# Patient Record
Sex: Female | Born: 1960 | Race: White | Hispanic: No | Marital: Single | Smoking: Former smoker
Health system: Southern US, Community
[De-identification: ages and names within clinical notes are randomized; demographics above are authoritative.]

## PROBLEM LIST (undated history)

## (undated) DIAGNOSIS — J449 Chronic obstructive pulmonary disease, unspecified: Secondary | ICD-10-CM

## (undated) DIAGNOSIS — F319 Bipolar disorder, unspecified: Secondary | ICD-10-CM

## (undated) DIAGNOSIS — M1990 Unspecified osteoarthritis, unspecified site: Secondary | ICD-10-CM

## (undated) HISTORY — PX: APPENDECTOMY: SHX54

## (undated) HISTORY — PX: ABDOMINAL HYSTERECTOMY: SHX81

## (undated) HISTORY — PX: KNEE SURGERY: SHX244

---

## 2000-09-09 ENCOUNTER — Emergency Department (HOSPITAL_COMMUNITY): Admission: EM | Admit: 2000-09-09 | Discharge: 2000-09-09 | Payer: Self-pay | Admitting: Emergency Medicine

## 2000-09-17 ENCOUNTER — Emergency Department (HOSPITAL_COMMUNITY): Admission: EM | Admit: 2000-09-17 | Discharge: 2000-09-17 | Payer: Self-pay | Admitting: Emergency Medicine

## 2000-09-17 ENCOUNTER — Encounter: Payer: Self-pay | Admitting: Emergency Medicine

## 2003-10-02 ENCOUNTER — Emergency Department (HOSPITAL_COMMUNITY): Admission: EM | Admit: 2003-10-02 | Discharge: 2003-10-02 | Payer: Self-pay | Admitting: Emergency Medicine

## 2005-03-10 ENCOUNTER — Emergency Department (HOSPITAL_COMMUNITY): Admission: EM | Admit: 2005-03-10 | Discharge: 2005-03-10 | Payer: Self-pay | Admitting: Emergency Medicine

## 2005-05-02 ENCOUNTER — Inpatient Hospital Stay (HOSPITAL_COMMUNITY): Admission: EM | Admit: 2005-05-02 | Discharge: 2005-05-04 | Payer: Self-pay | Admitting: Emergency Medicine

## 2007-10-12 ENCOUNTER — Emergency Department (HOSPITAL_COMMUNITY): Admission: EM | Admit: 2007-10-12 | Discharge: 2007-10-13 | Payer: Self-pay | Admitting: Emergency Medicine

## 2008-01-04 ENCOUNTER — Emergency Department (HOSPITAL_COMMUNITY): Admission: EM | Admit: 2008-01-04 | Discharge: 2008-01-04 | Payer: Self-pay | Admitting: Emergency Medicine

## 2008-09-27 ENCOUNTER — Emergency Department (HOSPITAL_COMMUNITY): Admission: EM | Admit: 2008-09-27 | Discharge: 2008-09-28 | Payer: Self-pay | Admitting: Emergency Medicine

## 2008-09-30 ENCOUNTER — Ambulatory Visit: Payer: Self-pay | Admitting: Psychiatry

## 2008-09-30 ENCOUNTER — Emergency Department (HOSPITAL_COMMUNITY): Admission: EM | Admit: 2008-09-30 | Discharge: 2008-09-30 | Payer: Self-pay | Admitting: Emergency Medicine

## 2008-09-30 ENCOUNTER — Inpatient Hospital Stay (HOSPITAL_COMMUNITY): Admission: EM | Admit: 2008-09-30 | Discharge: 2008-10-02 | Payer: Self-pay | Admitting: Psychiatry

## 2008-10-01 ENCOUNTER — Emergency Department (HOSPITAL_COMMUNITY): Admission: EM | Admit: 2008-10-01 | Discharge: 2008-10-01 | Payer: Self-pay | Admitting: Emergency Medicine

## 2008-12-07 ENCOUNTER — Emergency Department (HOSPITAL_COMMUNITY): Admission: EM | Admit: 2008-12-07 | Discharge: 2008-12-08 | Payer: Self-pay | Admitting: Internal Medicine

## 2008-12-08 ENCOUNTER — Inpatient Hospital Stay (HOSPITAL_COMMUNITY): Admission: EM | Admit: 2008-12-08 | Discharge: 2008-12-13 | Payer: Self-pay | Admitting: Emergency Medicine

## 2009-01-24 ENCOUNTER — Emergency Department (HOSPITAL_COMMUNITY): Admission: EM | Admit: 2009-01-24 | Discharge: 2009-01-25 | Payer: Self-pay | Admitting: Emergency Medicine

## 2010-05-21 ENCOUNTER — Emergency Department (HOSPITAL_COMMUNITY)
Admission: EM | Admit: 2010-05-21 | Discharge: 2010-05-22 | Disposition: A | Discharge status: Home / Self Care | Payer: Medicaid Other | Attending: Emergency Medicine | Admitting: Emergency Medicine

## 2010-05-21 DIAGNOSIS — R0600 Dyspnea, unspecified: Secondary | ICD-10-CM

## 2010-05-22 ENCOUNTER — Emergency Department (HOSPITAL_COMMUNITY): Payer: Medicaid Other

## 2010-05-22 DIAGNOSIS — R059 Cough, unspecified: Secondary | ICD-10-CM | POA: Insufficient documentation

## 2010-05-22 DIAGNOSIS — J811 Chronic pulmonary edema: Secondary | ICD-10-CM | POA: Insufficient documentation

## 2010-05-22 DIAGNOSIS — R0602 Shortness of breath: Secondary | ICD-10-CM | POA: Insufficient documentation

## 2010-05-22 DIAGNOSIS — F17200 Nicotine dependence, unspecified, uncomplicated: Secondary | ICD-10-CM | POA: Insufficient documentation

## 2010-05-22 DIAGNOSIS — R05 Cough: Secondary | ICD-10-CM | POA: Insufficient documentation

## 2010-05-22 DIAGNOSIS — R609 Edema, unspecified: Secondary | ICD-10-CM | POA: Insufficient documentation

## 2010-06-02 ENCOUNTER — Emergency Department (HOSPITAL_COMMUNITY)
Admission: EM | Admit: 2010-06-02 | Discharge: 2010-06-02 | Disposition: A | Discharge status: Home / Self Care | Payer: Medicaid Other | Attending: Emergency Medicine | Admitting: Emergency Medicine

## 2010-06-02 ENCOUNTER — Emergency Department (HOSPITAL_COMMUNITY): Payer: Medicaid Other

## 2010-06-02 DIAGNOSIS — J449 Chronic obstructive pulmonary disease, unspecified: Secondary | ICD-10-CM | POA: Insufficient documentation

## 2010-06-02 DIAGNOSIS — J4489 Other specified chronic obstructive pulmonary disease: Secondary | ICD-10-CM | POA: Insufficient documentation

## 2010-06-02 DIAGNOSIS — Z856 Personal history of leukemia: Secondary | ICD-10-CM | POA: Insufficient documentation

## 2010-06-02 DIAGNOSIS — F17200 Nicotine dependence, unspecified, uncomplicated: Secondary | ICD-10-CM | POA: Insufficient documentation

## 2010-06-02 DIAGNOSIS — R0602 Shortness of breath: Secondary | ICD-10-CM | POA: Insufficient documentation

## 2010-06-02 DIAGNOSIS — R079 Chest pain, unspecified: Secondary | ICD-10-CM | POA: Insufficient documentation

## 2010-06-02 DIAGNOSIS — F319 Bipolar disorder, unspecified: Secondary | ICD-10-CM | POA: Insufficient documentation

## 2010-07-27 LAB — CULTURE, BLOOD (ROUTINE X 2): Culture: NO GROWTH

## 2010-07-27 LAB — DIFFERENTIAL
Basophils Absolute: 0.1 10*3/uL (ref 0.0–0.1)
Basophils Relative: 1 % (ref 0–1)
Lymphocytes Relative: 25 % (ref 12–46)
Lymphs Abs: 2.1 10*3/uL (ref 0.7–4.0)
Monocytes Absolute: 0.6 10*3/uL (ref 0.1–1.0)
Monocytes Relative: 7 % (ref 3–12)
Neutrophils Relative %: 66 % (ref 43–77)

## 2010-07-27 LAB — CBC
HCT: 40.8 % (ref 36.0–46.0)
Hemoglobin: 13.5 g/dL (ref 12.0–15.0)
MCHC: 33.8 g/dL (ref 30.0–36.0)
Platelets: 137 10*3/uL — ABNORMAL LOW (ref 150–400)
Platelets: 158 10*3/uL (ref 150–400)
RBC: 4.07 MIL/uL (ref 3.87–5.11)
RDW: 13.8 % (ref 11.5–15.5)
WBC: 8.4 10*3/uL (ref 4.0–10.5)

## 2010-07-27 LAB — COMPREHENSIVE METABOLIC PANEL
ALT: 34 U/L (ref 0–35)
BUN: 13 mg/dL (ref 6–23)
BUN: 13 mg/dL (ref 6–23)
CO2: 24 mEq/L (ref 19–32)
Calcium: 8.1 mg/dL — ABNORMAL LOW (ref 8.4–10.5)
Creatinine, Ser: 0.87 mg/dL (ref 0.4–1.2)
Creatinine, Ser: 1.11 mg/dL (ref 0.4–1.2)
GFR calc Af Amer: >60 mL/min (ref >60)
GFR calc non Af Amer: >60 mL/min (ref >60)
Glucose, Bld: 153 mg/dL — ABNORMAL HIGH (ref 70–99)
Glucose, Bld: 95 mg/dL (ref 70–99)
Sodium: 137 mEq/L (ref 135–145)
Total Bilirubin: 0.4 mg/dL (ref 0.3–1.2)
Total Bilirubin: 0.6 mg/dL (ref 0.3–1.2)
Total Protein: 6.2 g/dL (ref 6.0–8.3)

## 2010-07-27 LAB — BASIC METABOLIC PANEL
BUN: 15 mg/dL (ref 6–23)
CO2: 28 mEq/L (ref 19–32)
Calcium: 9.4 mg/dL (ref 8.4–10.5)
GFR calc non Af Amer: >60 mL/min (ref >60)
Glucose, Bld: 101 mg/dL — ABNORMAL HIGH (ref 70–99)

## 2010-07-27 LAB — APTT: aPTT: 27 seconds (ref 24–37)

## 2010-07-27 LAB — ETHANOL: Alcohol, Ethyl (B): <5 mg/dL (ref 0–10)

## 2010-07-27 LAB — HEMOGLOBIN A1C: Mean Plasma Glucose: 111 mg/dL

## 2010-07-27 LAB — T4, FREE: Free T4: 1.24 ng/dL (ref 0.80–1.80)

## 2010-07-27 LAB — PROTIME-INR: INR: 1.2 (ref 0.00–1.49)

## 2010-07-27 LAB — LITHIUM LEVEL: Lithium Lvl: <0.25 mEq/L — ABNORMAL LOW (ref 0.80–1.40)

## 2010-07-29 LAB — POCT I-STAT, CHEM 8
BUN: 26 mg/dL — ABNORMAL HIGH (ref 6–23)
Calcium, Ion: 1.2 mmol/L (ref 1.12–1.32)
Chloride: 102 mEq/L (ref 96–112)
Creatinine, Ser: 1 mg/dL (ref 0.4–1.2)
Glucose, Bld: 90 mg/dL (ref 70–99)
TCO2: 24 mmol/L (ref 0–100)

## 2010-07-29 LAB — DIFFERENTIAL
Basophils Absolute: 0 10*3/uL (ref 0.0–0.1)
Basophils Relative: 1 % (ref 0–1)
Lymphocytes Relative: 19 % (ref 12–46)
Monocytes Absolute: 0.4 10*3/uL (ref 0.1–1.0)
Neutro Abs: 4.5 10*3/uL (ref 1.7–7.7)
Neutrophils Relative %: 73 % (ref 43–77)

## 2010-07-29 LAB — BASIC METABOLIC PANEL
CO2: 24 mEq/L (ref 19–32)
Calcium: 9 mg/dL (ref 8.4–10.5)
Creatinine, Ser: 1.07 mg/dL (ref 0.4–1.2)
GFR calc Af Amer: >60 mL/min (ref >60)
GFR calc non Af Amer: 54 mL/min — ABNORMAL LOW (ref >60)
Glucose, Bld: 113 mg/dL — ABNORMAL HIGH (ref 70–99)
Sodium: 140 mEq/L (ref 135–145)

## 2010-07-29 LAB — RAPID URINE DRUG SCREEN, HOSP PERFORMED
Barbiturates: NOT DETECTED
Cocaine: POSITIVE — AB
Opiates: POSITIVE — AB

## 2010-07-29 LAB — CBC
Hemoglobin: 13.6 g/dL (ref 12.0–15.0)
MCHC: 33.8 g/dL (ref 30.0–36.0)
RDW: 13.1 % (ref 11.5–15.5)

## 2010-07-29 LAB — POCT PREGNANCY, URINE: Preg Test, Ur: NEGATIVE
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
## 2011-05-15 ENCOUNTER — Emergency Department (HOSPITAL_COMMUNITY)
Admission: EM | Admit: 2011-05-15 | Discharge: 2011-05-15 | Disposition: A | Discharge status: Home / Self Care | Payer: Medicaid Other | Attending: Emergency Medicine | Admitting: Emergency Medicine

## 2011-05-15 ENCOUNTER — Other Ambulatory Visit (HOSPITAL_COMMUNITY): Payer: Self-pay | Admitting: Emergency Medicine

## 2011-05-15 ENCOUNTER — Emergency Department (HOSPITAL_COMMUNITY): Payer: Medicaid Other

## 2011-05-15 ENCOUNTER — Encounter (HOSPITAL_COMMUNITY): Payer: Self-pay | Admitting: *Deleted

## 2011-05-15 DIAGNOSIS — S62309A Unspecified fracture of unspecified metacarpal bone, initial encounter for closed fracture: Secondary | ICD-10-CM

## 2011-05-15 DIAGNOSIS — M79609 Pain in unspecified limb: Secondary | ICD-10-CM | POA: Insufficient documentation

## 2011-05-15 DIAGNOSIS — I498 Other specified cardiac arrhythmias: Secondary | ICD-10-CM | POA: Insufficient documentation

## 2011-05-15 DIAGNOSIS — R52 Pain, unspecified: Secondary | ICD-10-CM

## 2011-05-15 DIAGNOSIS — W101XXA Fall (on)(from) sidewalk curb, initial encounter: Secondary | ICD-10-CM | POA: Insufficient documentation

## 2011-05-15 MED ORDER — HYDROCODONE-ACETAMINOPHEN 5-325 MG PO TABS
1.0000 | ORAL_TABLET | Freq: Once | ORAL | Status: AC
  Administered 2011-05-15: 1 via ORAL
  Filled 2011-05-15: qty 1

## 2011-05-15 MED ORDER — HYDROCODONE-ACETAMINOPHEN 5-325 MG PO TABS: 1.0000 | ORAL_TABLET | ORAL | Status: AC | PRN

## 2011-05-15 MED ORDER — LIDOCAINE HCL 2 % IJ SOLN
INTRAMUSCULAR | Status: AC
  Filled 2011-05-15: qty 1

## 2011-05-15 NOTE — ED Notes (Signed)
Per ems: walking tripped over a curve. Pt denies any loc. Pt states she is having right hand pain.

## 2011-05-15 NOTE — ED Provider Notes (Signed)
History     CSN: [id number]  Arrival date & time 05/15/11  1121   First MD Initiated Contact with Patient 05/15/11 1128      Chief Complaint  Patient presents with  . Hand Pain    (Consider location/radiation/quality/duration/timing/severity/associated sxs/prior treatment) Patient is a 51 y.o. female presenting with hand pain. The history is provided by the patient.  Hand Pain This is a new problem. The current episode started today. The problem occurs constantly. The problem has been unchanged. Pertinent negatives include no chills, fever, numbness or weakness. Exacerbated by: movement. She has tried nothing for the symptoms.  Pt tripped over a curb and landed on right hand. There is associated swelling. There is no associated abrasion, laceration, numbness, tingling, or weakness.  Past Medical History  Diagnosis Date  . Bipolar 1 disorder   . Emphysema     Past Surgical History  Procedure Date  . Abdominal hysterectomy     No family history on file.  History  Substance Use Topics  . Smoking status: Current Everyday Smoker  . Smokeless tobacco: Never Used  . Alcohol Use: Yes     Review of Systems  Constitutional: Negative for fever and chills.  Musculoskeletal: Negative for gait problem.       See HPI  Skin: Negative for color change and wound.  Neurological: Negative for weakness and numbness.    Allergies  Review of patient's allergies indicates no known allergies.  Home Medications   Current Outpatient Rx  Name Route Sig Dispense Refill  . DEPAKOTE PO Oral Take by mouth. Dosage unknown    . LITHIUM CARBONATE PO Oral Take by mouth. Dosage unknown    . SEROQUEL PO Oral Take 1 tablet by mouth daily. Pt doesn't know dosage or what pharmacy she fills this at    . TRAZODONE HCL PO Oral Take by mouth. Dosage unknown      BP 134/86  Pulse 106  Temp(Src) 97.7 F (36.5 C) (Oral)  Resp 18  SpO2 96%  Physical Exam  Vitals reviewed. Constitutional: She is  oriented to person, place, and time. She appears well-developed and well-nourished.       Uncomfortable appearing  HENT:  Head: Normocephalic and atraumatic.  Right Ear: External ear normal.  Left Ear: External ear normal.  Eyes: Pupils are equal, round, and reactive to light.  Neck: Neck supple.  Cardiovascular: Regular rhythm and intact distal pulses.        Slightly tachycardic  Pulmonary/Chest: Effort normal. No respiratory distress.  Musculoskeletal:       Right hand: She exhibits decreased range of motion, tenderness and swelling. She exhibits normal capillary refill and no deformity. normal sensation noted. She exhibits no finger abduction.       Hands: Neurological: She is alert and oriented to person, place, and time.       Sensation intact to light touch. Good 2 point discrimination.  Skin: Skin is warm and dry.       Healing abrasion over 5th MCP joint- from prior injury per pt    ED Course  Procedures (including critical care time)  Labs Reviewed - No data to display Dg Hand Complete Left  05/15/2011  *RADIOLOGY REPORT*  Clinical Data: Fall.  Hand injury and pain.  LEFT HAND - COMPLETE 3+ VIEW  Comparison: None.  Findings: A minimally-displaced fracture of the distal fourth metacarpal is seen, which remains in the near anatomic alignment. No other acute fractures are identified.  No evidence of  dislocation.  IMPRESSION: Distal fourth metacarpal fracture, without significant displacement or angulation.  Original Report Authenticated By: Danae Orleans, M.D.     Dx 1: Fourth metacarpal Fx   MDM  Mechanical fall. Although x-ray indicates left hand, I have confirmed with the patient and technologist that the right hand was imaged. 4th Metacarpal fx as noted above. Ulna gutter splint placed. Ortho f/u. Pt advised of plan.        Elwyn Reach [employer], [place] 05/15/11 1356

## 2011-05-16 NOTE — ED Provider Notes (Signed)
Medical screening examination/treatment/procedure(s) were performed by non-physician practitioner and as supervising physician I was immediately available for consultation/collaboration.  Jayvien Rowlette, MD 05/16/11 0705 

## 2011-05-24 ENCOUNTER — Encounter (HOSPITAL_COMMUNITY): Payer: Self-pay

## 2011-05-24 ENCOUNTER — Emergency Department (HOSPITAL_COMMUNITY): Payer: Medicaid Other

## 2011-05-24 ENCOUNTER — Encounter (HOSPITAL_COMMUNITY): Payer: Self-pay | Admitting: *Deleted

## 2011-05-24 ENCOUNTER — Emergency Department (HOSPITAL_COMMUNITY)
Admission: EM | Admit: 2011-05-24 | Discharge: 2011-05-24 | Disposition: A | Discharge status: Home / Self Care | Payer: Medicaid Other | Attending: Emergency Medicine | Admitting: Emergency Medicine

## 2011-05-24 ENCOUNTER — Emergency Department (HOSPITAL_COMMUNITY)
Admission: EM | Admit: 2011-05-24 | Discharge: 2011-05-25 | Discharge status: Against Medical Advice | Payer: Medicaid Other | Attending: Emergency Medicine | Admitting: Emergency Medicine

## 2011-05-24 DIAGNOSIS — R079 Chest pain, unspecified: Secondary | ICD-10-CM | POA: Insufficient documentation

## 2011-05-24 DIAGNOSIS — R52 Pain, unspecified: Secondary | ICD-10-CM | POA: Insufficient documentation

## 2011-05-24 DIAGNOSIS — R059 Cough, unspecified: Secondary | ICD-10-CM | POA: Insufficient documentation

## 2011-05-24 DIAGNOSIS — R05 Cough: Secondary | ICD-10-CM | POA: Insufficient documentation

## 2011-05-24 DIAGNOSIS — R000 Tachycardia, unspecified: Secondary | ICD-10-CM | POA: Insufficient documentation

## 2011-05-24 DIAGNOSIS — R6883 Chills (without fever): Secondary | ICD-10-CM | POA: Insufficient documentation

## 2011-05-24 DIAGNOSIS — M25539 Pain in unspecified wrist: Secondary | ICD-10-CM | POA: Insufficient documentation

## 2011-05-24 DIAGNOSIS — J069 Acute upper respiratory infection, unspecified: Secondary | ICD-10-CM | POA: Insufficient documentation

## 2011-05-24 DIAGNOSIS — R609 Edema, unspecified: Secondary | ICD-10-CM | POA: Insufficient documentation

## 2011-05-24 DIAGNOSIS — R112 Nausea with vomiting, unspecified: Secondary | ICD-10-CM | POA: Insufficient documentation

## 2011-05-24 MED ORDER — OXYCODONE-ACETAMINOPHEN 5-325 MG PO TABS
1.0000 | ORAL_TABLET | Freq: Once | ORAL | Status: AC
  Administered 2011-05-24: 1 via ORAL
  Filled 2011-05-24: qty 1

## 2011-05-24 MED ORDER — IBUPROFEN 200 MG PO TABS
400.0000 mg | ORAL_TABLET | Freq: Once | ORAL | Status: AC
  Administered 2011-05-24: 400 mg via ORAL
  Filled 2011-05-24: qty 2

## 2011-05-24 NOTE — ED Provider Notes (Signed)
History    51yf with cough and subjective fever and chills. Gradual onset about 3d ago. Persistent since. Cough nonproductive. No sob. Nausea and vomiting x1 yesterday. Feels achy.  Specifically denied diarrhea to me although mentioned in triage note. No sore throat or ear pain. No sick contacts. Denies hx of blood clot. Is a smoker.   CSN: [id number]  Arrival date & time 05/24/11  1512   First MD Initiated Contact with Patient 05/24/11 1523      Chief Complaint  Patient presents with  . Influenza  . Nausea  . Emesis  . Diarrhea    (Consider location/radiation/quality/duration/timing/severity/associated sxs/prior treatment) HPI  Past Medical History  Diagnosis Date  . Bipolar 1 disorder   . Emphysema   . Polysubstance abuse   . Bronchitis   . Emphysema     Past Surgical History  Procedure Date  . Abdominal hysterectomy     No family history on file.  History  Substance Use Topics  . Smoking status: Current Everyday Smoker  . Smokeless tobacco: Never Used  . Alcohol Use: Yes    OB History    Grav Para Term Preterm Abortions TAB SAB Ect Mult Living                  Review of Systems   Review of symptoms negative unless otherwise noted in HPI.   Allergies  Review of patient's allergies indicates no known allergies.  Home Medications   Current Outpatient Rx  Name Route Sig Dispense Refill  . DIVALPROEX SODIUM 500 MG PO TBEC Oral Take 500 mg by mouth 3 (three) times daily.    Marland Kitchen HYDROCHLOROTHIAZIDE 25 MG PO TABS Oral Take 25 mg by mouth daily.    Marland Kitchen HYDROCODONE-ACETAMINOPHEN 5-325 MG PO TABS Oral Take 1-2 tablets by mouth every 4 (four) hours as needed for pain. 20 tablet 0  . LITHIUM CARBONATE 150 MG PO CAPS Oral Take 150 mg by mouth 2 (two) times daily with a meal.      BP 111/68  Pulse 91  Temp(Src) 99.2 F (37.3 C) (Oral)  Resp 20  SpO2 98%  Physical Exam  Nursing note and vitals reviewed. Constitutional: She appears well-developed and  well-nourished. No distress.  HENT:  Head: Normocephalic and atraumatic.  Right Ear: External ear normal.  Left Ear: External ear normal.  Mouth/Throat: Oropharynx is clear and moist.       TMs clear b/l. Mucus membranes moist.   Eyes: Conjunctivae are normal. Pupils are equal, round, and reactive to light. Right eye exhibits no discharge. Left eye exhibits no discharge.  Neck: Normal range of motion. Neck supple.  Cardiovascular: Regular rhythm and normal heart sounds.  Exam reveals no gallop and no friction rub.   No murmur heard.      Mild tachycardia. No murmur or rub.  Pulmonary/Chest: Effort normal and breath sounds normal. No respiratory distress. She has no wheezes. She exhibits no tenderness.  Abdominal: Soft. She exhibits no distension. There is no tenderness.  Musculoskeletal: She exhibits edema. She exhibits no tenderness.       Mild symmetric LE edema. No calf tenderness. Negative Homan's.  Lymphadenopathy:    She has no cervical adenopathy.  Neurological: She is alert.  Skin: Skin is warm and dry. She is not diaphoretic.  Psychiatric: She has a normal mood and affect. Her behavior is normal. Thought content normal.    ED Course  Procedures (including critical care time)  Labs Reviewed - No data  to display No results found.   1. URI (upper respiratory infection)       MDM  51 year old female multiple complaints. Suspect viral URI. Doubt bacterial etiology. Patient is no respiratory distress on examination. Patient is requesting Percocet for pain from her recent fall and hand fracture. Patient has not followed up with orthopedics. Given the duration of time since his injury patient was instructed to take NSAIDs because of the need to follow up with orthopedics as soon as she can.        Raeford Razor, MD 06/04/11 [phone]

## 2011-05-24 NOTE — ED Notes (Signed)
Per [facility]- pt presents with no acute distress c/o of N/V/D x 3 days and body aches- chills

## 2011-05-24 NOTE — ED Notes (Signed)
Pt states she has flu-like symptoms x 3 days with nausea, vomiting, diarrhea, and chills pt states also positive fever. Pt is alert and oriented at this time.

## 2011-05-24 NOTE — ED Notes (Signed)
Pt states that she "has been here since 1400 today, arriving by ambulance" she "had to reenter to be seen".  Pt now stating that she is having flu like symptoms including body aches and chills.

## 2011-05-24 NOTE — ED Notes (Signed)
Discharge instructions given, verbalized understanding

## 2011-05-24 NOTE — ED Notes (Signed)
Pt requesting one percocet until she can follow up with an MD on Monday for broken arm.

## 2011-05-24 NOTE — ED Notes (Signed)
Patient transported to X-ray 

## 2011-05-25 NOTE — ED Notes (Signed)
Pt left very disgruntled and yelling.  Refused to sign AMA.

## 2011-06-25 ENCOUNTER — Encounter (HOSPITAL_COMMUNITY): Payer: Self-pay | Admitting: *Deleted

## 2011-06-25 ENCOUNTER — Emergency Department (HOSPITAL_COMMUNITY): Payer: Medicaid Other

## 2011-06-25 ENCOUNTER — Emergency Department (HOSPITAL_COMMUNITY)
Admission: EM | Admit: 2011-06-25 | Discharge: 2011-06-25 | Disposition: A | Discharge status: Home / Self Care | Payer: Medicaid Other | Attending: Emergency Medicine | Admitting: Emergency Medicine

## 2011-06-25 DIAGNOSIS — R509 Fever, unspecified: Secondary | ICD-10-CM | POA: Insufficient documentation

## 2011-06-25 DIAGNOSIS — J3489 Other specified disorders of nose and nasal sinuses: Secondary | ICD-10-CM | POA: Insufficient documentation

## 2011-06-25 DIAGNOSIS — J189 Pneumonia, unspecified organism: Secondary | ICD-10-CM | POA: Insufficient documentation

## 2011-06-25 DIAGNOSIS — R079 Chest pain, unspecified: Secondary | ICD-10-CM | POA: Insufficient documentation

## 2011-06-25 DIAGNOSIS — R05 Cough: Secondary | ICD-10-CM | POA: Insufficient documentation

## 2011-06-25 DIAGNOSIS — J438 Other emphysema: Secondary | ICD-10-CM | POA: Insufficient documentation

## 2011-06-25 DIAGNOSIS — R059 Cough, unspecified: Secondary | ICD-10-CM | POA: Insufficient documentation

## 2011-06-25 DIAGNOSIS — F319 Bipolar disorder, unspecified: Secondary | ICD-10-CM | POA: Insufficient documentation

## 2011-06-25 DIAGNOSIS — M549 Dorsalgia, unspecified: Secondary | ICD-10-CM | POA: Insufficient documentation

## 2011-06-25 MED ORDER — OXYCODONE-ACETAMINOPHEN 5-325 MG PO TABS: 1.0000 | ORAL_TABLET | ORAL | Status: AC | PRN

## 2011-06-25 MED ORDER — KETOROLAC TROMETHAMINE 60 MG/2ML IM SOLN
60.0000 mg | Freq: Once | INTRAMUSCULAR | Status: AC
  Administered 2011-06-25: 60 mg via INTRAMUSCULAR
  Filled 2011-06-25: qty 2

## 2011-06-25 MED ORDER — HYDROCODONE-HOMATROPINE 5-1.5 MG/5ML PO SYRP: 5.0000 mL | ORAL_SOLUTION | Freq: Four times a day (QID) | ORAL | Status: AC | PRN

## 2011-06-25 MED ORDER — ALBUTEROL SULFATE HFA 108 (90 BASE) MCG/ACT IN AERS: 1.0000 | INHALATION_SPRAY | Freq: Four times a day (QID) | RESPIRATORY_TRACT | Status: DC | PRN

## 2011-06-25 MED ORDER — AZITHROMYCIN 250 MG PO TABS: 250.0000 mg | ORAL_TABLET | Freq: Every day | ORAL | Status: AC

## 2011-06-25 NOTE — ED Notes (Signed)
Pt in by EMS, c/o lower R back pain x5 days. Also reports cough/congestion. Denies n/v/d, body aches.

## 2011-06-25 NOTE — Progress Notes (Signed)
ED Cm contacted by ED RN, Consuella Lose to assist pt with how to find pcp.  Spoke with pt who has lived in [place] but recently moved by from [place], [place]. Pt allow time to ventilate her feelings.   Pt provided list of [place] accepting providers and other [place] resources. CM reviewed with pt the process for changing MD and county information with DSS office so that new pcp will be able to see her [facility] provider).  Voiced understanding and appreciation of services.  Needing various amounts of redirecting during visit with pt.  Encouraged pt to complete process to obtain a pcp vs frequent ED visits

## 2011-06-25 NOTE — ED Notes (Signed)
Pt states "about 3 weeks ago I came over here, I told the doctors then I had pneumonia, they x-rayed me but I don't want to talk about it, my right side hurts"; pt indicates right rib area

## 2011-06-25 NOTE — Discharge Instructions (Signed)
Your x-ray showed a likely pneumonia in your right lung. This is most likely causing your pain. Please take all the antibiotics as prescribed. Use the pain medicine and cough medicine as needed. If you're having worsening chest pain or shortness of breath, worsening swelling in your legs, or any other worrisome symptoms, please return to the ER.  Pneumonia, Adult Pneumonia is an infection of the lungs.  CAUSES Pneumonia may be caused by bacteria or a virus. Usually, these infections are caused by breathing infectious particles into the lungs (respiratory tract). SYMPTOMS   Cough.   Fever.   Chest pain.   Increased rate of breathing.   Wheezing.   Mucus production.  DIAGNOSIS  If you have the common symptoms of pneumonia, your caregiver will typically confirm the diagnosis with a chest X-ray. The X-ray will show an abnormality in the lung (pulmonary infiltrate) if you have pneumonia. Other tests of your blood, urine, or sputum may be done to find the specific cause of your pneumonia. Your caregiver may also do tests (blood gases or pulse oximetry) to see how well your lungs are working. TREATMENT  Some forms of pneumonia may be spread to other people when you cough or sneeze. You may be asked to wear a mask before and during your exam. Pneumonia that is caused by bacteria is treated with antibiotic medicine. Pneumonia that is caused by the influenza virus may be treated with an antiviral medicine. Most other viral infections must run their course. These infections will not respond to antibiotics.  PREVENTION A pneumococcal shot (vaccine) is available to prevent a common bacterial cause of pneumonia. This is usually suggested for:  People over 43 years old.   Patients on chemotherapy.   People with chronic lung problems, such as bronchitis or emphysema.   People with immune system problems.  If you are over 65 or have a high risk condition, you may receive the pneumococcal vaccine if  you have not received it before. In some countries, a routine influenza vaccine is also recommended. This vaccine can help prevent some cases of pneumonia.You may be offered the influenza vaccine as part of your care. If you smoke, it is time to quit. You may receive instructions on how to stop smoking. Your caregiver can provide medicines and counseling to help you quit. HOME CARE INSTRUCTIONS   Cough suppressants may be used if you are losing too much rest. However, coughing protects you by clearing your lungs. You should avoid using cough suppressants if you can.   Your caregiver may have prescribed medicine if he or she thinks your pneumonia is caused by a bacteria or influenza. Finish your medicine even if you start to feel better.   Your caregiver may also prescribe an expectorant. This loosens the mucus to be coughed up.   Only take over-the-counter or prescription medicines for pain, discomfort, or fever as directed by your caregiver.   Do not smoke. Smoking is a common cause of bronchitis and can contribute to pneumonia. If you are a smoker and continue to smoke, your cough may last several weeks after your pneumonia has cleared.   A cold steam vaporizer or humidifier in your room or home may help loosen mucus.   Coughing is often worse at night. Sleeping in a semi-upright position in a recliner or using a couple pillows under your head will help with this.   Get rest as you feel it is needed. Your body will usually let you know when you  need to rest.  SEEK IMMEDIATE MEDICAL CARE IF:   Your illness becomes worse. This is especially true if you are elderly or weakened from any other disease.   You cannot control your cough with suppressants and are losing sleep.   You begin coughing up blood.   You develop pain which is getting worse or is uncontrolled with medicines.   You have a fever.   Any of the symptoms which initially brought you in for treatment are getting worse  rather than better.   You develop shortness of breath or chest pain.  MAKE SURE YOU:   Understand these instructions.   Will watch your condition.   Will get help right away if you are not doing well or get worse.  Document Released: 04/07/2005 Document Revised: 03/27/2011 Document Reviewed: 06/27/2010 [facility] Patient Information 2012 [place], [place].

## 2011-06-25 NOTE — ED Provider Notes (Signed)
History     CSN: [id number]  Arrival date & time 06/25/11  1755   First MD Initiated Contact with Patient 06/25/11 1900      Chief Complaint  Patient presents with  . Back Pain  . Cough  . Nasal Congestion    (Consider location/radiation/quality/duration/timing/severity/associated sxs/prior treatment) The history is provided by the patient.   51 year old female with past medical history of tobacco abuse, emphysema, bipolar presents with right rib pain, cough, achiness. She states that she was seen here in early February for cough, subjective fever and chills. A chest x-ray was performed at that time which was negative for pneumonia or other acute findings. She states that she has had increased pain to the area since about one week ago. The pain is described as sharp and stabbing, and is located to the right lower posterior ribs. There no known aggravating or alleviating factors. It is not positional or pleuritic in nature. It is not worsened with movement. She does have an associated cough, which she states is productive of scant sputum. She has had no associated leg swelling or shortness of breath. Denies abdominal pain, nausea, vomiting, diarrhea. Denies fever or chills.  Patient has no prior history of DVT/PE. Denies recent trauma, surgery, or prolonged immobilization. Denies hemoptysis or use of exogenous estrogen. No personal hx cancer.  Past Medical History  Diagnosis Date  . Bipolar 1 disorder   . Emphysema   . Polysubstance abuse   . Bronchitis   . Emphysema     Past Surgical History  Procedure Date  . Abdominal hysterectomy   . Knee surgery     laser to left    No family history on file.  History  Substance Use Topics  . Smoking status: Current Everyday Smoker -- 0.5 packs/day  . Smokeless tobacco: Never Used  . Alcohol Use: Yes    OB History    Grav Para Term Preterm Abortions TAB SAB Ect Mult Living                  Review of Systems  Constitutional:  Positive for fatigue. Negative for fever, chills, activity change, appetite change and unexpected weight change.  HENT: Negative for sore throat, facial swelling, rhinorrhea and neck pain.   Eyes: Negative.   Respiratory: Negative for chest tightness and shortness of breath.   Cardiovascular: Negative for palpitations and leg swelling.  Gastrointestinal: Negative for nausea, vomiting, abdominal pain and diarrhea.  Genitourinary: Negative for flank pain.  Musculoskeletal: Positive for myalgias.  Skin: Negative for color change and rash.  Neurological: Negative for dizziness, weakness and headaches.    Allergies  Review of patient's allergies indicates no known allergies.  Home Medications   Current Outpatient Rx  Name Route Sig Dispense Refill  . ALBUTEROL SULFATE HFA 108 (90 BASE) MCG/ACT IN AERS Inhalation Inhale 2 puffs into the lungs every 6 (six) hours as needed.    Marland Kitchen DIVALPROEX SODIUM 500 MG PO TBEC Oral Take 500 mg by mouth 2 (two) times daily.     Marland Kitchen LITHIUM CARBONATE 150 MG PO CAPS Oral Take 150 mg by mouth 2 (two) times daily with a meal.    . VITAMIN B-12 1000 MCG PO TABS Oral Take 1,000 mcg by mouth daily.      BP 128/81  Pulse 88  Temp(Src) 97.8 F (36.6 C) (Oral)  Resp 24  SpO2 96%  Physical Exam  Nursing note and vitals reviewed. Constitutional: She appears well-developed and well-nourished. No distress.  HENT:  Head: Normocephalic and atraumatic.  Right Ear: External ear normal.  Left Ear: External ear normal.  Nose: Nose normal.  Mouth/Throat: Oropharynx is clear and moist. No oropharyngeal exudate.       TMs clear bilaterally. There is no sinus tenderness to palpation.  Eyes: Conjunctivae and EOM are normal. Pupils are equal, round, and reactive to light.  Neck: Normal range of motion.  Cardiovascular: Normal rate, regular rhythm and normal heart sounds.   Pulmonary/Chest: Effort normal and breath sounds normal. She has no wheezes.       Mild tenderness  to palpation over right posterior lower ribs without obvious deformity, crepitus, swelling. Expansion is equal bilaterally on respiration.  Abdominal: Soft. Bowel sounds are normal. There is no tenderness. There is no rebound and no guarding.  Musculoskeletal: Normal range of motion.       Mild symmetric lower extremity edema without calf tenderness. No erythema noted.  Neurological: She is alert.  Skin: Skin is warm and dry. She is not diaphoretic.  Psychiatric: She has a normal mood and affect.    ED Course  Procedures (including critical care time)  Labs Reviewed - No data to display Dg Ribs Unilateral W/chest Right  06/25/2011  *RADIOLOGY REPORT*  Clinical Data: No injury.  Right posterior rib pain  RIGHT RIBS AND CHEST - 3+ VIEW  Comparison: 05/24/2011  Findings: COPD with hyperinflation.  Patchy right lower lobe airspace disease was not present previously and may represent pneumonia.  No significant pleural effusion.  Right rib detail reveals no rib fracture or rib lesion.  IMPRESSION: Mild right lower lobe infiltrate, possible pneumonia.  Negative for right rib fracture.  Original Report Authenticated By: Camelia Phenes, M.D.     1. Community acquired pneumonia       MDM  Patient with one week of pain to the right lateral ribs. This pain is not pleuritic in nature, and it is not worsened with movement. Her vital signs today are normal, with normal oxygen saturation, respirations, pulse, and temperature. Chest x-ray, which I personally reviewed and reviewed with the patient, shows mild appearing patchy right lower lobe infiltrate. Will treat as CAP. Pt given rx for abx and analgesics for pain. Strict return precautions discussed.       Grant Fontana, [place] 06/26/11 [phone]

## 2011-06-28 NOTE — ED Provider Notes (Signed)
Medical screening examination/treatment/procedure(s) were performed by non-physician practitioner and as supervising physician I was immediately available for consultation/collaboration.  Ernesta Trabert, MD 06/28/11 1719 

## 2011-07-18 ENCOUNTER — Encounter (HOSPITAL_COMMUNITY): Payer: Self-pay | Admitting: Adult Health

## 2011-07-18 ENCOUNTER — Emergency Department (HOSPITAL_COMMUNITY): Payer: Medicaid Other

## 2011-07-18 ENCOUNTER — Emergency Department (HOSPITAL_COMMUNITY)
Admission: EM | Admit: 2011-07-18 | Discharge: 2011-07-18 | Disposition: A | Discharge status: Home / Self Care | Payer: Medicaid Other | Attending: Emergency Medicine | Admitting: Emergency Medicine

## 2011-07-18 DIAGNOSIS — J40 Bronchitis, not specified as acute or chronic: Secondary | ICD-10-CM

## 2011-07-18 DIAGNOSIS — F319 Bipolar disorder, unspecified: Secondary | ICD-10-CM | POA: Insufficient documentation

## 2011-07-18 DIAGNOSIS — J438 Other emphysema: Secondary | ICD-10-CM | POA: Insufficient documentation

## 2011-07-18 DIAGNOSIS — F17200 Nicotine dependence, unspecified, uncomplicated: Secondary | ICD-10-CM | POA: Insufficient documentation

## 2011-07-18 LAB — POCT I-STAT, CHEM 8
BUN: 29 mg/dL — ABNORMAL HIGH (ref 6–23)
Creatinine, Ser: 0.9 mg/dL (ref 0.50–1.10)
Potassium: 4.1 mEq/L (ref 3.5–5.1)
Sodium: 144 mEq/L (ref 135–145)
TCO2: 20 mmol/L (ref 0–100)

## 2011-07-18 LAB — D-DIMER, QUANTITATIVE: D-Dimer, Quant: 0.25 ug/mL-FEU (ref 0.00–0.48)

## 2011-07-18 MED ORDER — HYDROCODONE-HOMATROPINE 5-1.5 MG/5ML PO SYRP: 5.0000 mL | ORAL_SOLUTION | Freq: Four times a day (QID) | ORAL | Status: AC | PRN

## 2011-07-18 MED ORDER — ALBUTEROL SULFATE HFA 108 (90 BASE) MCG/ACT IN AERS: 2.0000 | INHALATION_SPRAY | RESPIRATORY_TRACT | Status: DC | PRN

## 2011-07-18 MED ORDER — PREDNISONE 20 MG PO TABS
60.0000 mg | ORAL_TABLET | Freq: Once | ORAL | Status: AC
  Administered 2011-07-18: 60 mg via ORAL
  Filled 2011-07-18: qty 3

## 2011-07-18 MED ORDER — ALBUTEROL SULFATE (5 MG/ML) 0.5% IN NEBU
5.0000 mg | INHALATION_SOLUTION | Freq: Once | RESPIRATORY_TRACT | Status: AC
  Administered 2011-07-18: 5 mg via RESPIRATORY_TRACT
  Filled 2011-07-18: qty 1

## 2011-07-18 MED ORDER — AZITHROMYCIN 250 MG PO TABS: 250.0000 mg | ORAL_TABLET | Freq: Every day | ORAL | Status: AC

## 2011-07-18 MED ORDER — IPRATROPIUM BROMIDE 0.02 % IN SOLN
RESPIRATORY_TRACT | Status: AC
  Filled 2011-07-18: qty 2.5

## 2011-07-18 MED ORDER — ALBUTEROL SULFATE (5 MG/ML) 0.5% IN NEBU
INHALATION_SOLUTION | RESPIRATORY_TRACT | Status: AC
  Filled 2011-07-18: qty 1

## 2011-07-18 MED ORDER — PREDNISONE 20 MG PO TABS: 60.0000 mg | ORAL_TABLET | Freq: Every day | ORAL | Status: DC

## 2011-07-18 MED ORDER — CYCLOBENZAPRINE HCL 10 MG PO TABS: 10.0000 mg | ORAL_TABLET | Freq: Two times a day (BID) | ORAL | Status: AC | PRN

## 2011-07-18 MED ORDER — HYDROCOD POLST-CHLORPHEN POLST 10-8 MG/5ML PO LQCR
5.0000 mL | Freq: Once | ORAL | Status: AC
  Administered 2011-07-18: 5 mL via ORAL
  Filled 2011-07-18: qty 5

## 2011-07-18 MED ORDER — IPRATROPIUM BROMIDE 0.02 % IN SOLN
0.5000 mg | Freq: Once | RESPIRATORY_TRACT | Status: AC
  Administered 2011-07-18: 0.5 mg via RESPIRATORY_TRACT
  Filled 2011-07-18: qty 2.5

## 2011-07-18 MED ORDER — ALBUTEROL SULFATE (5 MG/ML) 0.5% IN NEBU
5.0000 mg | INHALATION_SOLUTION | Freq: Once | RESPIRATORY_TRACT | Status: AC
  Administered 2011-07-18: 2.5 mg via RESPIRATORY_TRACT

## 2011-07-18 NOTE — Discharge Instructions (Signed)
Your chest x-ray today did not show any signs of pneumonia. There are no other signs of any emergent conditions. I think you have bronchitis and you may have strained a muscle on the right side of your chest. Take ibuprofen for pain. Albuterol inhaler every 4 hrs. Take zithromax as prescribed until all gone for the infection. Take prednisone daily as prescribed. Flexeril for muscle spasms. Hycodan for pain and cough, do not drive if taking. Follow up with your doctor in 3 days.   RESOURCE GUIDE  Dental Problems  Patients with Medicaid: [facility] [phone] [address]                                           [phone] [address] Phone:  [phone]                                                  Phone:  [phone]  If unable to pay or uninsured, contact:  [facility] or [facility]. to become qualified for the adult dental clinic.  Chronic Pain Problems Contact [facility]  [phone] Patients need to be referred by their primary care doctor.  Insufficient Money for Medicine Contact [employer]:  call "211" or [facility] [phone].  No Primary Care Doctor Call [facility]  [phone] Other agencies that provide inexpensive medical care    [facility]  [phone]    [facility]  [phone]    [facility]  [phone]    [facility]  [phone]    [facility]  [phone]    [facility]  [phone]  [facility]ological [facility]  [phone] [facility] Services  [phone] [facility]   [phone] (emergency services [phone])  Substance Abuse Resources [facility]  [phone] [facility] [phone] [facility] [place] [phone] Floydene Flock [phone] Residential & Outpatient Substance Abuse Program  [phone]  Abuse/Neglect [facility] Child Abuse Hotline 9080620023 [facility] Child Abuse Hotline 630-305-6072 (After Hours)  Emergency Shelter [facility] [phone]  Maternity Homes [facility] [facility] 330-128-7174 [facility] [phone]  MRSA Hotline #:   7638043455    [facility] Resources  [facility]     [employer]                          [facility] Dept. [address] [place]                       [address]      [address]  Patrecia Pace  Michell Heinrich Phone:  [phone]                                   Phone:  [phone]                 Phone:  [phone]  [facility] Phone:  [phone]  [facility] Child Abuse Hotline (805) 306-1594 563-549-1033 (After Hours)  Bronchitis Bronchitis is the body's way of reacting to injury and/or infection (inflammation) of the bronchi. Bronchi are the air tubes that extend from the windpipe into the lungs. If the inflammation becomes severe, it may cause shortness of breath. CAUSES  Inflammation may be caused by:  A virus.   Germs (bacteria).   Dust.   Allergens.   Pollutants and many other irritants.  The cells lining the bronchial tree are covered with tiny hairs (cilia). These constantly beat upward, away from the lungs, toward the mouth. This keeps the lungs free of pollutants. When these cells become too irritated and are unable to do their job, mucus begins to develop. This causes the characteristic cough of bronchitis. The cough clears the lungs when the cilia are unable to do their job. Without either of these protective mechanisms, the mucus would settle in the lungs. Then you would develop pneumonia. Smoking is a common cause of bronchitis and can contribute to pneumonia. Stopping this habit is the single most important thing you can do to help yourself. TREATMENT    Your caregiver may prescribe an antibiotic if the cough is caused by bacteria. Also, medicines that open up your airways make it easier to breathe. Your caregiver may also recommend or prescribe an expectorant. It will loosen the mucus to be coughed up. Only take over-the-counter or prescription medicines for pain, discomfort, or fever as directed by your caregiver.   Removing whatever causes the problem (smoking, for example) is critical to preventing the problem from getting worse.   Cough suppressants may be prescribed for relief of cough symptoms.   Inhaled medicines may be prescribed to help with symptoms now and to help prevent problems from returning.   For those with recurrent (chronic) bronchitis, there may be a need for steroid medicines.  SEEK IMMEDIATE MEDICAL CARE IF:   During treatment, you develop more pus-like mucus (purulent sputum).   You have a fever.   Your baby is older than 3 months with a rectal temperature of 102 F (38.9 C) or higher.   Your baby is 8 months old or younger with a rectal temperature of 100.4 F (38 C) or higher.   You become progressively more ill.   You have increased difficulty breathing, wheezing, or shortness of breath.  It is necessary to seek immediate medical care if you are elderly or sick from any other disease. MAKE SURE YOU:   Understand these instructions.   Will watch your condition.   Will get help right away if you are not doing well or get worse.  Document Released: 04/07/2005 Document Revised: 03/27/2011 Document Reviewed: 02/15/2008 [facility] Patient Information 2012 [place], [place].

## 2011-07-18 NOTE — ED Notes (Signed)
staes she was recently dx with PNA, over the last three days complains of right side pain that radiates from lower ribcage up to right side of bra strap. Pt is 98-100% on RA, breathing very fast due to right rib pain. Bilateral breath sounds heard. Pt is speaking in full sentences

## 2011-07-18 NOTE — ED Provider Notes (Signed)
History     CSN: [id number]  Arrival date & time 07/18/11  1625   First MD Initiated Contact with Patient 07/18/11 1657      Chief Complaint  Patient presents with  . Shortness of Breath  . Pain    (Consider location/radiation/quality/duration/timing/severity/associated sxs/prior treatment) Patient is a 51 y.o. female presenting with shortness of breath. The history is provided by the patient.  Shortness of Breath  The current episode started 2 days ago. The onset was sudden. The problem occurs continuously. The problem has been gradually worsening. The problem is moderate. The symptoms are relieved by nothing. The symptoms are aggravated by activity (coughing). Associated symptoms include chest pain, cough, shortness of breath and wheezing. Pertinent negatives include no chest pressure and no fever.  Pt states she was diagnosed with pneumonia 4 wks ago. States was treated with antibiotics, symptoms improved. In last 3 days developed same symptoms of pain in right ribs, cough with green productive sputum, shortness of breath. Pt denies fever, chills, nausea, vomiting, abdominal pain. States not taking any medications. Pain worsened with cough and deep breathing, as well as movement.   Past Medical History  Diagnosis Date  . Bipolar 1 disorder   . Emphysema   . Polysubstance abuse   . Bronchitis   . Emphysema     Past Surgical History  Procedure Date  . Abdominal hysterectomy   . Knee surgery     laser to left    History reviewed. No pertinent family history.  History  Substance Use Topics  . Smoking status: Current Everyday Smoker -- 0.5 packs/day  . Smokeless tobacco: Never Used  . Alcohol Use: Yes    OB History    Grav Para Term Preterm Abortions TAB SAB Ect Mult Living                  Review of Systems  Constitutional: Positive for chills. Negative for fever.  HENT: Negative.   Eyes: Negative.   Respiratory: Positive for cough, shortness of breath and  wheezing.   Cardiovascular: Positive for chest pain and leg swelling.  Gastrointestinal: Negative.   Genitourinary: Negative.   Musculoskeletal: Negative.   Skin: Negative.   Neurological: Negative.   Psychiatric/Behavioral: Negative.     Allergies  Review of patient's allergies indicates no known allergies.  Home Medications   Current Outpatient Rx  Name Route Sig Dispense Refill  . ALBUTEROL SULFATE HFA 108 (90 BASE) MCG/ACT IN AERS Inhalation Inhale 2 puffs into the lungs every 6 (six) hours as needed.    Marland Kitchen DIVALPROEX SODIUM 500 MG PO TBEC Oral Take 500 mg by mouth 2 (two) times daily.     . IBUPROFEN 200 MG PO TABS Oral Take 200 mg by mouth every 8 (eight) hours as needed. For pain    . LITHIUM CARBONATE 150 MG PO CAPS Oral Take 300 mg by mouth at bedtime.     Marland Kitchen QUETIAPINE FUMARATE 300 MG PO TABS Oral Take 600 mg by mouth at bedtime.    Marland Kitchen VITAMIN B-12 1000 MCG PO TABS Oral Take 1,000 mcg by mouth daily.      BP 124/85  Pulse 103  Temp(Src) 97.9 F (36.6 C) (Oral)  Resp 22  SpO2 100%  Physical Exam  Nursing note and vitals reviewed. Constitutional: She is oriented to person, place, and time. She appears well-developed and well-nourished. No distress.  HENT:  Head: Normocephalic and atraumatic.  Eyes: Conjunctivae are normal.  Neck: Neck supple.  Cardiovascular:  Normal rate, regular rhythm and normal heart sounds.   Pulmonary/Chest: Effort normal.       Mild expiratory wheezes bilaterally. Right mid axillary rib tednereness. No bruising, swelling, rash noted  Abdominal: Soft. Bowel sounds are normal. She exhibits no distension. There is no tenderness. There is no rebound.  Musculoskeletal: Normal range of motion. She exhibits no edema.  Neurological: She is alert and oriented to person, place, and time.  Skin: Skin is warm and dry.  Psychiatric: She has a normal mood and affect.    ED Course  Procedures (including critical care time)  Pt with cough, right sided  rib pain, pleuritic. Hx of bronchitis and pneuminia. Will get istat chem 8, ddimer, cxr.  Results for orders placed during the hospital encounter of 07/18/11  D-DIMER, QUANTITATIVE      Component Value Range   D-Dimer, Quant 0.25  0.00 - 0.48 (ug/mL-FEU)  POCT I-STAT, CHEM 8      Component Value Range   Sodium 144  135 - 145 (mEq/L)   Potassium 4.1  3.5 - 5.1 (mEq/L)   Chloride 114 (*) 96 - 112 (mEq/L)   BUN 29 (*) 6 - 23 (mg/dL)   Creatinine, Ser 1.61  0.50 - 1.10 (mg/dL)   Glucose, Bld 096 (*) 70 - 99 (mg/dL)   Calcium, Ion 0.45  4.09 - 1.32 (mmol/L)   TCO2 20  0 - 100 (mmol/L)   Hemoglobin 12.9  12.0 - 15.0 (g/dL)   HCT 81.1  91.4 - 78.2 (%)   Dg Chest 2 View  07/18/2011  *RADIOLOGY REPORT*  Clinical Data: Short of breath.  Emphysema.  Cigarette smoker.  CHEST - 2 VIEW  Comparison: 06/25/2011 radiographs.  Findings: No pneumothorax.  Emphysema is present.  Mild basilar atelectasis.  Cardiopericardial silhouette and mediastinal contours are within normal limits.  Trachea midline. No airspace disease. No effusion.  IMPRESSION: Emphysema without acute cardiopulmonary disease.  Original Report Authenticated By: Andreas Newport, M.D.   6:22 PM CXR negative for PNA, pt afebrile. Ddimer negative. Doubt PE. Neb and steroids given in ED. I belive this is bronchitis and exacerbation of her COPD. Pt states she knows this is pneumonia. Asked for antibiotic. WIll treat with steroids, inhaler, z-pack.  VS normal other then slight tachcyardia which i think  Filed Vitals:   07/18/11 1627  BP: 124/85  Pulse: 103  Temp: 97.9 F (36.6 C)  Resp: 22      No diagnosis found.    MDM          Lottie Mussel, PA 07/18/11 2055

## 2011-07-27 NOTE — ED Provider Notes (Signed)
Evaluation and management procedures were performed by the PA/NP/Resident Physician under my supervision/collaboration.   Felisa Bonier, MD 07/27/11 [phone]

## 2012-04-02 ENCOUNTER — Encounter (HOSPITAL_COMMUNITY): Payer: Self-pay | Admitting: Emergency Medicine

## 2012-04-02 ENCOUNTER — Emergency Department (HOSPITAL_COMMUNITY)
Admission: EM | Admit: 2012-04-02 | Discharge: 2012-04-02 | Disposition: A | Discharge status: Home / Self Care | Payer: Medicaid Other | Attending: Emergency Medicine | Admitting: Emergency Medicine

## 2012-04-02 ENCOUNTER — Emergency Department (HOSPITAL_COMMUNITY): Payer: Medicaid Other

## 2012-04-02 DIAGNOSIS — F1910 Other psychoactive substance abuse, uncomplicated: Secondary | ICD-10-CM | POA: Insufficient documentation

## 2012-04-02 DIAGNOSIS — R5381 Other malaise: Secondary | ICD-10-CM | POA: Insufficient documentation

## 2012-04-02 DIAGNOSIS — IMO0001 Reserved for inherently not codable concepts without codable children: Secondary | ICD-10-CM

## 2012-04-02 DIAGNOSIS — R110 Nausea: Secondary | ICD-10-CM | POA: Insufficient documentation

## 2012-04-02 DIAGNOSIS — Z760 Encounter for issue of repeat prescription: Secondary | ICD-10-CM | POA: Insufficient documentation

## 2012-04-02 DIAGNOSIS — R05 Cough: Secondary | ICD-10-CM | POA: Insufficient documentation

## 2012-04-02 DIAGNOSIS — R197 Diarrhea, unspecified: Secondary | ICD-10-CM | POA: Insufficient documentation

## 2012-04-02 DIAGNOSIS — J3489 Other specified disorders of nose and nasal sinuses: Secondary | ICD-10-CM | POA: Insufficient documentation

## 2012-04-02 DIAGNOSIS — Z79899 Other long term (current) drug therapy: Secondary | ICD-10-CM | POA: Insufficient documentation

## 2012-04-02 DIAGNOSIS — J441 Chronic obstructive pulmonary disease with (acute) exacerbation: Secondary | ICD-10-CM | POA: Insufficient documentation

## 2012-04-02 DIAGNOSIS — R5383 Other fatigue: Secondary | ICD-10-CM | POA: Insufficient documentation

## 2012-04-02 DIAGNOSIS — R059 Cough, unspecified: Secondary | ICD-10-CM | POA: Insufficient documentation

## 2012-04-02 DIAGNOSIS — F319 Bipolar disorder, unspecified: Secondary | ICD-10-CM | POA: Insufficient documentation

## 2012-04-02 DIAGNOSIS — R0789 Other chest pain: Secondary | ICD-10-CM | POA: Insufficient documentation

## 2012-04-02 DIAGNOSIS — F17200 Nicotine dependence, unspecified, uncomplicated: Secondary | ICD-10-CM | POA: Insufficient documentation

## 2012-04-02 LAB — BASIC METABOLIC PANEL
Chloride: 103 mEq/L (ref 96–112)
Creatinine, Ser: 0.78 mg/dL (ref 0.50–1.10)
GFR calc Af Amer: >90 mL/min (ref >90)
Potassium: 4 mEq/L (ref 3.5–5.1)

## 2012-04-02 LAB — CBC
RDW: 13.6 % (ref 11.5–15.5)
WBC: 5.4 10*3/uL (ref 4.0–10.5)

## 2012-04-02 MED ORDER — ALBUTEROL SULFATE HFA 108 (90 BASE) MCG/ACT IN AERS: 1.0000 | INHALATION_SPRAY | Freq: Four times a day (QID) | RESPIRATORY_TRACT | Status: DC | PRN

## 2012-04-02 MED ORDER — ONDANSETRON HCL 4 MG/2ML IJ SOLN
4.0000 mg | Freq: Once | INTRAMUSCULAR | Status: AC
  Administered 2012-04-02: 4 mg via INTRAVENOUS
  Filled 2012-04-02: qty 2

## 2012-04-02 MED ORDER — IPRATROPIUM BROMIDE 0.02 % IN SOLN
0.5000 mg | Freq: Once | RESPIRATORY_TRACT | Status: AC
  Administered 2012-04-02: 0.5 mg via RESPIRATORY_TRACT
  Filled 2012-04-02: qty 2.5

## 2012-04-02 MED ORDER — LITHIUM CARBONATE 150 MG PO CAPS: 150.0000 mg | ORAL_CAPSULE | Freq: Three times a day (TID) | ORAL | Status: DC

## 2012-04-02 MED ORDER — SODIUM CHLORIDE 0.9 % IV BOLUS (SEPSIS)
1000.0000 mL | Freq: Once | INTRAVENOUS | Status: AC
  Administered 2012-04-02: 1000 mL via INTRAVENOUS

## 2012-04-02 MED ORDER — PREDNISONE 20 MG PO TABS
60.0000 mg | ORAL_TABLET | Freq: Once | ORAL | Status: AC
  Administered 2012-04-02: 60 mg via ORAL
  Filled 2012-04-02: qty 3

## 2012-04-02 MED ORDER — QUETIAPINE FUMARATE 300 MG PO TABS: 300.0000 mg | ORAL_TABLET | Freq: Every day | ORAL | Status: DC

## 2012-04-02 MED ORDER — DIVALPROEX SODIUM 500 MG PO DR TAB: 500.0000 mg | DELAYED_RELEASE_TABLET | ORAL | Status: DC

## 2012-04-02 MED ORDER — ALBUTEROL SULFATE (5 MG/ML) 0.5% IN NEBU
5.0000 mg | INHALATION_SOLUTION | Freq: Once | RESPIRATORY_TRACT | Status: AC
  Administered 2012-04-02: 5 mg via RESPIRATORY_TRACT
  Filled 2012-04-02: qty 1

## 2012-04-02 MED ORDER — PREDNISONE 10 MG PO TABS: 20.0000 mg | ORAL_TABLET | Freq: Every day | ORAL | Status: DC

## 2012-04-02 NOTE — ED Notes (Signed)
Per EMS: Pt reports feeling nauseated since last Thursday. Pt developed shortness of breath about an hour ago. Pt has hx of COPD and is an active smoker.

## 2012-04-02 NOTE — ED Provider Notes (Signed)
History     CSN: [id number]  Arrival date & time 04/02/12  1248   First MD Initiated Contact with Patient 04/02/12 1340      Chief Complaint  Patient presents with  . Shortness of Breath  . Nausea    (Consider location/radiation/quality/duration/timing/severity/associated sxs/prior treatment) Patient is a 51 y.o. female presenting with shortness of breath. The history is provided by the patient. No language interpreter was used.  Shortness of Breath  The current episode started 5 to 7 days ago. The onset was gradual. The problem occurs occasionally. The problem has been gradually worsening. The problem is moderate. Nothing relieves the symptoms. Associated symptoms include cough and shortness of breath. Pertinent negatives include no chest pain, no chest pressure, no fever, no rhinorrhea, no sore throat and no wheezing. She has inhaled smoke recently. She has had intermittent steroid use. She has had prior hospitalizations. She has had no prior ICU admissions. She has had no prior intubations. She has been behaving normally. She has received no recent medical care.  51yo female with c/o 1 week of upper respiratory symptoms with cough,  hoarse voice and nausea but no vomiting.  Out of albuterol inhaler. Sob started today.  Taking over the counter sinus medication with mild improvement. She is from [facility] visiting.   pmh copd emphysema, bipolar (off meds), smoker  and bronchitis.  Denies fever, vomiting,  sore throat, or abdominal pain.  Speaking complete sentences in no distress. No pcp. Out of bipolar meds x 2 weeks.    Past Medical History  Diagnosis Date  . Bipolar 1 disorder   . Emphysema   . Polysubstance abuse   . Bronchitis   . Emphysema     Past Surgical History  Procedure Date  . Abdominal hysterectomy   . Knee surgery     laser to left    No family history on file.  History  Substance Use Topics  . Smoking status: Current Every Day Smoker -- 0.5 packs/day  .  Smokeless tobacco: Never Used  . Alcohol Use: Yes    OB History    Grav Para Term Preterm Abortions TAB SAB Ect Mult Living                  Review of Systems  Constitutional: Positive for fatigue. Negative for fever.  HENT: Positive for congestion and sinus pressure. Negative for sore throat and rhinorrhea.   Eyes: Negative.   Respiratory: Positive for cough, chest tightness and shortness of breath. Negative for wheezing.   Cardiovascular: Negative.  Negative for chest pain and leg swelling.  Gastrointestinal: Positive for nausea and diarrhea. Negative for vomiting and abdominal distention.  Neurological: Negative.   Psychiatric/Behavioral: Negative.   All other systems reviewed and are negative.    Allergies  Review of patient's allergies indicates no known allergies.  Home Medications   Current Outpatient Rx  Name  Route  Sig  Dispense  Refill  . ALBUTEROL SULFATE HFA 108 (90 BASE) MCG/ACT IN AERS   Inhalation   Inhale 2 puffs into the lungs every 6 (six) hours as needed. shortness of breath         . DIVALPROEX SODIUM 500 MG PO TBEC   Oral   Take 500 mg by mouth every morning.          Marland Kitchen FERROUS SULFATE 325 (65 FE) MG PO TABS   Oral   Take 325 mg by mouth daily as needed. When energy is low         .  IBUPROFEN 200 MG PO TABS   Oral   Take 200 mg by mouth every 8 (eight) hours as needed. For pain         . LITHIUM CARBONATE 150 MG PO CAPS   Oral   Take 150 mg by mouth every morning.          . ADULT MULTIVITAMIN W/MINERALS CH   Oral   Take 1 tablet by mouth every morning.         Marland Kitchen OVER THE COUNTER MEDICATION   Oral   Take 1 tablet by mouth daily as needed. Allergy tablet over the counter as needed for congestion         . QUETIAPINE FUMARATE 300 MG PO TABS   Oral   Take 300 mg by mouth at bedtime.          Marland Kitchen VITAMIN B-12 1000 MCG PO TABS   Oral   Take 1,000 mcg by mouth daily.           BP 127/77  Pulse 83  Temp 98.4 F (36.9  C) (Oral)  Resp 20  SpO2 98%  Physical Exam  Nursing note and vitals reviewed. Constitutional: She is oriented to person, place, and time. She appears well-developed and well-nourished.  HENT:  Head: Normocephalic and atraumatic.  Eyes: Conjunctivae normal and EOM are normal. Pupils are equal, round, and reactive to light.  Neck: Normal range of motion. Neck supple. No thyromegaly present.  Cardiovascular: Normal rate.   Pulmonary/Chest: Effort normal. She has no wheezes.       Decreased bs in the bases.   Abdominal: Soft. Bowel sounds are normal. She exhibits no distension.  Musculoskeletal: Normal range of motion. She exhibits no edema and no tenderness.  Neurological: She is alert and oriented to person, place, and time. She has normal reflexes.  Skin: Skin is warm and dry.  Psychiatric: She has a normal mood and affect. Her speech is rapid and/or pressured. She expresses impulsivity. She expresses no homicidal and no suicidal ideation. She expresses no suicidal plans and no homicidal plans.    ED Course  Procedures (including critical care time) NS bolus in ER.  Breathing better after albuterol neb and prednisone.    Labs Reviewed  CBC  BASIC METABOLIC PANEL   No results found.   No diagnosis found.    MDM  COPD exacerbation better after neb and prednisone.  rx for albuterol inhaler, and psych meds.  She will get a pcp and follow up with mental health when she goes home to [place] next week.  She understands to return for worsening symptoms.  O2 sat 99 on r/a. Chest x-ray shows no pneumonia reviewed by myself.  Labs unremarkable with normal wbc count.   Labs Reviewed  CBC  BASIC METABOLIC PANEL          Remi Haggard, NP 04/02/12 1850

## 2012-04-02 NOTE — ED Notes (Signed)
ZOX:[id number]<AV> Expected date:<BR> Expected time:12:46 PM<BR> Means of arrival:Ambulance<BR> Comments:<BR> Shob, clear lung sounds, stable

## 2012-04-04 ENCOUNTER — Emergency Department (HOSPITAL_COMMUNITY)
Admission: EM | Admit: 2012-04-04 | Discharge: 2012-04-04 | Disposition: A | Discharge status: Home / Self Care | Payer: Medicaid Other | Attending: Emergency Medicine | Admitting: Emergency Medicine

## 2012-04-04 ENCOUNTER — Emergency Department (HOSPITAL_COMMUNITY): Payer: Medicaid Other

## 2012-04-04 ENCOUNTER — Encounter (HOSPITAL_COMMUNITY): Payer: Self-pay | Admitting: Emergency Medicine

## 2012-04-04 DIAGNOSIS — F17200 Nicotine dependence, unspecified, uncomplicated: Secondary | ICD-10-CM | POA: Insufficient documentation

## 2012-04-04 DIAGNOSIS — W1809XA Striking against other object with subsequent fall, initial encounter: Secondary | ICD-10-CM | POA: Insufficient documentation

## 2012-04-04 DIAGNOSIS — F319 Bipolar disorder, unspecified: Secondary | ICD-10-CM | POA: Insufficient documentation

## 2012-04-04 DIAGNOSIS — Z79899 Other long term (current) drug therapy: Secondary | ICD-10-CM | POA: Insufficient documentation

## 2012-04-04 DIAGNOSIS — J438 Other emphysema: Secondary | ICD-10-CM | POA: Insufficient documentation

## 2012-04-04 DIAGNOSIS — W19XXXA Unspecified fall, initial encounter: Secondary | ICD-10-CM

## 2012-04-04 DIAGNOSIS — S0990XA Unspecified injury of head, initial encounter: Secondary | ICD-10-CM | POA: Insufficient documentation

## 2012-04-04 DIAGNOSIS — IMO0002 Reserved for concepts with insufficient information to code with codable children: Secondary | ICD-10-CM | POA: Insufficient documentation

## 2012-04-04 DIAGNOSIS — Y929 Unspecified place or not applicable: Secondary | ICD-10-CM | POA: Insufficient documentation

## 2012-04-04 DIAGNOSIS — Y9389 Activity, other specified: Secondary | ICD-10-CM | POA: Insufficient documentation

## 2012-04-04 LAB — CBC WITH DIFFERENTIAL/PLATELET
Basophils Absolute: 0 10*3/uL (ref 0.0–0.1)
HCT: 38.5 % (ref 36.0–46.0)
Hemoglobin: 13.5 g/dL (ref 12.0–15.0)
Lymphocytes Relative: 38 % (ref 12–46)
Monocytes Absolute: 0.6 10*3/uL (ref 0.1–1.0)
Neutro Abs: 4.7 10*3/uL (ref 1.7–7.7)
Neutrophils Relative %: 54 % (ref 43–77)
RDW: 13.6 % (ref 11.5–15.5)
WBC: 8.8 10*3/uL (ref 4.0–10.5)

## 2012-04-04 LAB — COMPREHENSIVE METABOLIC PANEL
ALT: 28 U/L (ref 0–35)
AST: 34 U/L (ref 0–37)
Alkaline Phosphatase: 59 U/L (ref 39–117)
CO2: 22 mEq/L (ref 19–32)
Chloride: 105 mEq/L (ref 96–112)
Creatinine, Ser: 0.91 mg/dL (ref 0.50–1.10)
GFR calc non Af Amer: 71 mL/min — ABNORMAL LOW (ref >90)
Potassium: 4.2 mEq/L (ref 3.5–5.1)
Total Bilirubin: 0.4 mg/dL (ref 0.3–1.2)

## 2012-04-04 LAB — PROTIME-INR: INR: 1.15 (ref 0.00–1.49)

## 2012-04-04 MED ORDER — SODIUM CHLORIDE 0.9 % IV BOLUS (SEPSIS)
1000.0000 mL | Freq: Once | INTRAVENOUS | Status: AC
  Administered 2012-04-04: 1000 mL via INTRAVENOUS

## 2012-04-04 MED ORDER — TRAMADOL HCL 50 MG PO TABS: 50.0000 mg | ORAL_TABLET | Freq: Four times a day (QID) | ORAL | Status: DC | PRN

## 2012-04-04 MED ORDER — IOHEXOL 300 MG/ML  SOLN
100.0000 mL | Freq: Once | INTRAMUSCULAR | Status: AC | PRN
  Administered 2012-04-04: 100 mL via INTRAVENOUS

## 2012-04-04 NOTE — ED Notes (Signed)
MD at bedside. 

## 2012-04-04 NOTE — ED Notes (Signed)
Prescriptions from previous visit in chart, none filled.

## 2012-04-04 NOTE — ED Notes (Signed)
Per EMS - Pt sitting on coach upon arrival of EMS, could not walk, pain in right side, and back of head. Pt was standing at stove over a pot of boiling water to steam her face, as she thought this would help her breathing. Pt told EMS she fell backwards, catching right side on chair and striking head on hard surface. Denies LOC, neurologically intact, PERRLA. BP - 137/100, HR - 100, O2 SAT 98% on 2L, 96% on RA. Pt was T&R in ED on 12/13 and given 5 prescriptions which she has not yet had filled.

## 2012-04-04 NOTE — ED Notes (Signed)
Bed:WA11<BR> Expected date:<BR> Expected time:<BR> Means of arrival:<BR> Comments:<BR> EMS

## 2012-04-04 NOTE — ED Provider Notes (Signed)
History     CSN: [id number]  Arrival date & time 04/04/12  1904   First MD Initiated Contact with Patient 04/04/12 1926      Chief Complaint  Patient presents with  . Fall    (Consider location/radiation/quality/duration/timing/severity/associated sxs/prior treatment) HPI The patient presents with right flank pain, hip pain.  Notably, she was seen here 2 days ago, notes that since that presentation she has not filled her prescriptions.  She denies any new respiratory complaints.  She states that just prior to arrival the patient stepped backwards, slipped, fell against a chair, striking her right flank forcefully.  She subsequently hit her head on a table.  No loss of consciousness, no vomiting, no visual changes, no ataxia, the patient.  Since the event she has had some pain in her head, though this is minimal compared to her right flank.  This pain is severe worse with palpation or motion.  She notes mild associated nausea.  No attempts at relief thus far.   Past Medical History  Diagnosis Date  . Bipolar 1 disorder   . Emphysema   . Polysubstance abuse   . Bronchitis   . Emphysema     Past Surgical History  Procedure Date  . Abdominal hysterectomy   . Knee surgery     laser to left    No family history on file.  History  Substance Use Topics  . Smoking status: Current Every Day Smoker -- 0.5 packs/day  . Smokeless tobacco: Never Used  . Alcohol Use: Yes    OB History    Grav Para Term Preterm Abortions TAB SAB Ect Mult Living                  Review of Systems  Constitutional:       Per HPI, otherwise negative  HENT:       Per HPI, otherwise negative  Eyes: Negative.   Respiratory:       Per HPI, otherwise negative  Cardiovascular:       Per HPI, otherwise negative  Gastrointestinal: Negative for vomiting.  Genitourinary: Negative.   Musculoskeletal:       Per HPI, otherwise negative  Skin: Negative.   Neurological: Negative for syncope.     Allergies  Review of patient's allergies indicates no known allergies.  Home Medications   Current Outpatient Rx  Name  Route  Sig  Dispense  Refill  . ALBUTEROL SULFATE HFA 108 (90 BASE) MCG/ACT IN AERS   Inhalation   Inhale 2 puffs into the lungs every 6 (six) hours as needed. shortness of breath         . DIVALPROEX SODIUM 500 MG PO TBEC   Oral   Take 500 mg by mouth every morning.          Marland Kitchen FERROUS SULFATE 325 (65 FE) MG PO TABS   Oral   Take 325 mg by mouth daily as needed. When energy is low         . IBUPROFEN 200 MG PO TABS   Oral   Take 200 mg by mouth every 8 (eight) hours as needed. For pain         . LITHIUM CARBONATE 150 MG PO CAPS   Oral   Take 150 mg by mouth every morning.          . ADULT MULTIVITAMIN W/MINERALS CH   Oral   Take 1 tablet by mouth every morning.         Marland Kitchen  OVER THE COUNTER MEDICATION   Oral   Take 1 tablet by mouth daily as needed. Allergy tablet over the counter as needed for congestion         . QUETIAPINE FUMARATE 300 MG PO TABS   Oral   Take 300 mg by mouth at bedtime.          Marland Kitchen VITAMIN B-12 1000 MCG PO TABS   Oral   Take 1,000 mcg by mouth daily.         Marland Kitchen PREDNISONE 10 MG PO TABS   Oral   Take 2 tablets (20 mg total) by mouth daily. Take 2 pills a day x 5 days   10 tablet   0     BP 148/74  Pulse 97  Temp 98.7 F (37.1 C) (Oral)  Resp 21  SpO2 95%  Physical Exam  Nursing note and vitals reviewed. Constitutional: She is oriented to person, place, and time. She appears well-developed and well-nourished. No distress.  HENT:  Head: Normocephalic and atraumatic.  Eyes: Conjunctivae normal and EOM are normal.  Cardiovascular: Normal rate and regular rhythm.   Pulmonary/Chest: Effort normal and breath sounds normal. No stridor. No respiratory distress.  Abdominal: She exhibits no distension. There is no hepatosplenomegaly. There is generalized tenderness. There is guarding. There is no rigidity,  no rebound and no CVA tenderness.  Musculoskeletal: She exhibits no edema.       Arms: Neurological: She is alert and oriented to person, place, and time. No cranial nerve deficit.  Skin: Skin is warm and dry.  Psychiatric: She has a normal mood and affect.    ED Course  Procedures (including critical care time)   Labs Reviewed  CBC WITH DIFFERENTIAL  COMPREHENSIVE METABOLIC PANEL  PROTIME-INR   Dg Chest 2 View  04/04/2012  *RADIOLOGY REPORT*  Clinical Data: Left chest and clavicle pain, shortness of breath  CHEST - 2 VIEW  Comparison: 04/02/2012  Findings: Chronic interstitial markings/emphysematous changes.  No focal consolidation. No pleural effusion or pneumothorax.  Cardiomediastinal silhouette is within normal limits.  Mild degenerative changes of the visualized thoracolumbar spine.  No fracture is seen.  IMPRESSION: No evidence of acute cardiopulmonary disease.  No fracture is seen.   Original Report Authenticated By: Charline Bills, M.D.      No diagnosis found.  O2- 97%ra, normal    MDM  The patient presents after a mechanical fall with pain, exquisite, in her right flank.  Given the extreme discomfort the patient had a CT scan performed.  This did not demonstrate retroperitoneal hemorrhage, nor fracture.  The patient was improved here with analgesics.  Absent distress, fever, CT evidence of ongoing internal bleeding, she was discharged.  I discussed the need for compliance with all medications, including those provided to her 2 days ago, and follow up with primary care physician.  She acknowledged this suggestion.  Gerhard Munch, MD 04/04/12 2322

## 2012-04-04 NOTE — ED Notes (Signed)
Pt transported to CT ?

## 2012-04-05 ENCOUNTER — Encounter (HOSPITAL_COMMUNITY): Payer: Self-pay | Admitting: Emergency Medicine

## 2012-04-05 ENCOUNTER — Emergency Department (HOSPITAL_COMMUNITY)
Admission: EM | Admit: 2012-04-05 | Discharge: 2012-04-05 | Disposition: A | Discharge status: Home / Self Care | Payer: Medicaid Other | Attending: Emergency Medicine | Admitting: Emergency Medicine

## 2012-04-05 DIAGNOSIS — F17200 Nicotine dependence, unspecified, uncomplicated: Secondary | ICD-10-CM | POA: Insufficient documentation

## 2012-04-05 DIAGNOSIS — Z79899 Other long term (current) drug therapy: Secondary | ICD-10-CM | POA: Insufficient documentation

## 2012-04-05 DIAGNOSIS — R51 Headache: Secondary | ICD-10-CM | POA: Insufficient documentation

## 2012-04-05 DIAGNOSIS — M549 Dorsalgia, unspecified: Secondary | ICD-10-CM

## 2012-04-05 DIAGNOSIS — J40 Bronchitis, not specified as acute or chronic: Secondary | ICD-10-CM | POA: Insufficient documentation

## 2012-04-05 DIAGNOSIS — F319 Bipolar disorder, unspecified: Secondary | ICD-10-CM | POA: Insufficient documentation

## 2012-04-05 DIAGNOSIS — Z87828 Personal history of other (healed) physical injury and trauma: Secondary | ICD-10-CM | POA: Insufficient documentation

## 2012-04-05 DIAGNOSIS — J438 Other emphysema: Secondary | ICD-10-CM | POA: Insufficient documentation

## 2012-04-05 DIAGNOSIS — F1910 Other psychoactive substance abuse, uncomplicated: Secondary | ICD-10-CM | POA: Insufficient documentation

## 2012-04-05 MED ORDER — TRAMADOL HCL 50 MG PO TABS
50.0000 mg | ORAL_TABLET | Freq: Once | ORAL | Status: AC
  Administered 2012-04-05: 50 mg via ORAL
  Filled 2012-04-05: qty 1

## 2012-04-05 NOTE — ED Notes (Signed)
Pt sitting in room writing in no apparent distress. VS WNL. Pt was discharged with wheelchair as requested during last visit which was less than 3 hours prior and wheelchair was locked in waiting room.

## 2012-04-05 NOTE — ED Provider Notes (Signed)
History     CSN: [id number]  Arrival date & time 04/05/12  0120   First MD Initiated Contact with Patient 04/05/12 0147      Chief Complaint  Patient presents with  . Fall    (Consider location/radiation/quality/duration/timing/severity/associated sxs/prior treatment) HPI Comments: Patient is waiting for transportation home and her dose of pain medication has "worn off" so she reregistered for a dose of medication until her ride arrives No new symptoms or re injury This is the 3rd ED visit since 12/13 when she was seen for a prescription refill which she did not fill   Patient is a 51 y.o. female presenting with fall. The history is provided by the patient.  Fall Associated symptoms include headaches. Pertinent negatives include no numbness.    Past Medical History  Diagnosis Date  . Bipolar 1 disorder   . Emphysema   . Polysubstance abuse   . Bronchitis   . Emphysema     Past Surgical History  Procedure Date  . Abdominal hysterectomy   . Knee surgery     laser to left    History reviewed. No pertinent family history.  History  Substance Use Topics  . Smoking status: Current Every Day Smoker -- 0.5 packs/day  . Smokeless tobacco: Never Used  . Alcohol Use: Yes    OB History    Grav Para Term Preterm Abortions TAB SAB Ect Mult Living                  Review of Systems  Constitutional: Negative for activity change.  Musculoskeletal: Positive for back pain. Negative for joint swelling.  Neurological: Positive for headaches. Negative for weakness and numbness.    Allergies  Review of patient's allergies indicates no known allergies.  Home Medications   Current Outpatient Rx  Name  Route  Sig  Dispense  Refill  . ALBUTEROL SULFATE HFA 108 (90 BASE) MCG/ACT IN AERS   Inhalation   Inhale 2 puffs into the lungs every 6 (six) hours as needed. shortness of breath         . DIVALPROEX SODIUM 500 MG PO TBEC   Oral   Take 500 mg by mouth every morning.           Marland Kitchen FERROUS SULFATE 325 (65 FE) MG PO TABS   Oral   Take 325 mg by mouth daily as needed. When energy is low         . IBUPROFEN 200 MG PO TABS   Oral   Take 200 mg by mouth every 8 (eight) hours as needed. For pain         . LITHIUM CARBONATE 150 MG PO CAPS   Oral   Take 150 mg by mouth every morning.          . ADULT MULTIVITAMIN W/MINERALS CH   Oral   Take 1 tablet by mouth every morning.         Marland Kitchen OVER THE COUNTER MEDICATION   Oral   Take 1 tablet by mouth daily as needed. Allergy tablet over the counter as needed for congestion         . QUETIAPINE FUMARATE 300 MG PO TABS   Oral   Take 300 mg by mouth at bedtime.          Marland Kitchen VITAMIN B-12 1000 MCG PO TABS   Oral   Take 1,000 mcg by mouth daily.         Marland Kitchen PREDNISONE 10 MG  PO TABS   Oral   Take 20 mg by mouth daily. Take 2 pills a day x 5 days         . TRAMADOL HCL 50 MG PO TABS   Oral   Take 50 mg by mouth every 6 (six) hours as needed. For pain           BP 112/80  Pulse 95  Temp 98.4 F (36.9 C) (Oral)  Resp 22  SpO2 96%  Physical Exam  Nursing note and vitals reviewed. Constitutional: She is oriented to person, place, and time. She appears well-developed and well-nourished.  Eyes: Pupils are equal, round, and reactive to light.  Neck: Normal range of motion.  Neurological: She is alert and oriented to person, place, and time.  Skin: Skin is warm.    ED Course  Procedures (including critical care time)  Labs Reviewed - No data to display Dg Chest 2 View  04/04/2012  *RADIOLOGY REPORT*  Clinical Data: Left chest and clavicle pain, shortness of breath  CHEST - 2 VIEW  Comparison: 04/02/2012  Findings: Chronic interstitial markings/emphysematous changes.  No focal consolidation. No pleural effusion or pneumothorax.  Cardiomediastinal silhouette is within normal limits.  Mild degenerative changes of the visualized thoracolumbar spine.  No fracture is seen.  IMPRESSION: No evidence  of acute cardiopulmonary disease.  No fracture is seen.   Original Report Authenticated By: Charline Bills, M.D.    Ct Abdomen Pelvis W Contrast  04/04/2012  *RADIOLOGY REPORT*  Clinical Data: Right flank pain status post fall  CT ABDOMEN AND PELVIS WITH CONTRAST  Technique:  Multidetector CT imaging of the abdomen and pelvis was performed following the standard protocol during bolus administration of intravenous contrast.  Contrast: OMNIPAQUE IOHEXOL 300 MG/ML  SOLN  Comparison: 10/01/2008  Findings: Limited images through the lung bases demonstrate no significant appreciable abnormality. The heart size is within normal limits. No pleural or pericardial effusion.  Unremarkable liver, biliary system, spleen, pancreas, adrenal glands.  Lobular renal contours with areas of cortical thinning and scarring bilaterally.  There are multiple nonobstructing renal stones and/or papillary tip calcifications bilaterally.  Otherwise symmetric renal enhancement.  No hydronephrosis or hydroureter.  No ureteral calculi.  No bowel obstruction.  No CT evidence for colitis.  Appendix not confidently identified.  No right lower quadrant inflammation.  No free intraperitoneal air or fluid.  No lymphadenopathy.  There is scattered atherosclerotic calcification of the aorta and its branches. No aneurysmal dilatation.  Partially decompressed bladder. Absent uterus.  No adnexal mass.  No acute osseous finding.  IMPRESSION: No acute abdominopelvic process.  Bilateral areas of renal cortical thinning, similar to prior.  Bilateral nonobstructing renal stones and/or papillary tip calcifications. No ureteral calculi or hydronephrosis.   Original Report Authenticated By: Jearld Lesch, M.D.      No diagnosis found.    MDM   Will give dose of Ultram in the ED         Arman Filter, NP 04/05/12 [phone]

## 2012-04-05 NOTE — ED Provider Notes (Signed)
Medical screening examination/treatment/procedure(s) were performed by non-physician practitioner and as supervising physician I was immediately available for consultation/collaboration.  Doug Sou, MD 04/05/12 1323

## 2012-04-05 NOTE — ED Provider Notes (Signed)
Medical screening examination/treatment/procedure(s) were performed by non-physician practitioner and as supervising physician I was immediately available for consultation/collaboration.  Sunnie Nielsen, MD 04/05/12 [phone]

## 2012-04-05 NOTE — ED Notes (Signed)
Pt waiting for ride home.

## 2012-04-05 NOTE — ED Notes (Signed)
Pt was seen and treated earlier today for a fall  Pt has been waiting in the lobby for her ride home and states her pain meds have worn off and needs something else for pain

## 2012-04-05 NOTE — ED Notes (Signed)
Pt asking, "So, this pill you're giving me is going to cover me until 0600 in the morning?" Pt is AAOx4 in no distress. Two discharge instructions and previous prescriptions from previous visits on her lap, not filled. Pt wheelchaired out to check out and waiting room.

## 2012-08-09 ENCOUNTER — Encounter (HOSPITAL_COMMUNITY): Payer: Self-pay | Admitting: Emergency Medicine

## 2012-08-09 ENCOUNTER — Emergency Department (HOSPITAL_COMMUNITY)
Admission: EM | Admit: 2012-08-09 | Discharge: 2012-08-09 | Disposition: A | Discharge status: Home / Self Care | Payer: Medicaid Other | Attending: Emergency Medicine | Admitting: Emergency Medicine

## 2012-08-09 DIAGNOSIS — F17200 Nicotine dependence, unspecified, uncomplicated: Secondary | ICD-10-CM | POA: Insufficient documentation

## 2012-08-09 DIAGNOSIS — Z8709 Personal history of other diseases of the respiratory system: Secondary | ICD-10-CM | POA: Insufficient documentation

## 2012-08-09 DIAGNOSIS — B850 Pediculosis due to Pediculus humanus capitis: Secondary | ICD-10-CM | POA: Insufficient documentation

## 2012-08-09 DIAGNOSIS — F319 Bipolar disorder, unspecified: Secondary | ICD-10-CM | POA: Insufficient documentation

## 2012-08-09 MED ORDER — SULFAMETHOXAZOLE-TRIMETHOPRIM 800-160 MG PO TABS: 1.0000 | ORAL_TABLET | Freq: Two times a day (BID) | ORAL | Status: DC

## 2012-08-09 MED ORDER — IVERMECTIN 0.5 % EX LOTN: 1.0000 "application " | TOPICAL_LOTION | Freq: Once | CUTANEOUS | Status: DC

## 2012-08-09 NOTE — Progress Notes (Signed)
[facility] ED CM noted pt with medicaid coverage but no pcp listed Spoke with pt who confirms no pcp but aware of how to obtain one with insurance coverage dss Pt plans to return to [place]

## 2012-08-09 NOTE — ED Provider Notes (Signed)
History     CSN: [id number]  Arrival date & time 08/09/12  1106   First MD Initiated Contact with Patient 08/09/12 1123      Chief Complaint  Patient presents with  . Head Lice    (Consider location/radiation/quality/duration/timing/severity/associated sxs/prior treatment) HPI The patient presents to the ER with head lice. The patient states that she noticed the lice 3 days ago. She states that she tried over the counter RID. The patient denies fever, nausea, or vomiting. The patient states she has had extreme itching as well.  Past Medical History  Diagnosis Date  . Bipolar 1 disorder   . Emphysema   . Polysubstance abuse   . Bronchitis   . Emphysema     Past Surgical History  Procedure Laterality Date  . Abdominal hysterectomy    . Knee surgery      laser to left    No family history on file.  History  Substance Use Topics  . Smoking status: Current Every Day Smoker -- 0.50 packs/day  . Smokeless tobacco: Never Used  . Alcohol Use: Yes    OB History   Grav Para Term Preterm Abortions TAB SAB Ect Mult Living                  Review of Systems All other systems negative except as documented in the HPI. All pertinent positives and negatives as reviewed in the HPI.  Allergies  Review of patient's allergies indicates no known allergies.  Home Medications   Current Outpatient Rx  Name  Route  Sig  Dispense  Refill  . albuterol (PROVENTIL HFA;VENTOLIN HFA) 108 (90 BASE) MCG/ACT inhaler   Inhalation   Inhale 2 puffs into the lungs every 6 (six) hours as needed. shortness of breath         . divalproex (DEPAKOTE) 500 MG DR tablet   Oral   Take 500 mg by mouth every morning.          . ferrous sulfate 325 (65 FE) MG tablet   Oral   Take 325 mg by mouth daily as needed. When energy is low         . ibuprofen (ADVIL,MOTRIN) 200 MG tablet   Oral   Take 200 mg by mouth every 8 (eight) hours as needed. For pain         . lithium carbonate 150 MG  capsule   Oral   Take 150 mg by mouth every morning.          . Multiple Vitamin (MULTIVITAMIN WITH MINERALS) TABS   Oral   Take 1 tablet by mouth every morning.         Marland Kitchen OVER THE COUNTER MEDICATION   Oral   Take 1 tablet by mouth daily as needed. Allergy tablet over the counter as needed for congestion         . QUEtiapine (SEROQUEL) 300 MG tablet   Oral   Take 300 mg by mouth at bedtime.          . traMADol (ULTRAM) 50 MG tablet   Oral   Take 50 mg by mouth every 6 (six) hours as needed. For pain         . vitamin B-12 (CYANOCOBALAMIN) 1000 MCG tablet   Oral   Take 1,000 mcg by mouth daily.           BP 126/95  Pulse 110  Temp(Src) 98 F (36.7 C) (Oral)  Resp 16  SpO2  98%  Physical Exam  Nursing note and vitals reviewed. Constitutional: She is oriented to person, place, and time. She appears well-developed and well-nourished.  HENT:  Head: Atraumatic.  Patient has lice noted through out her thick hair.   Pulmonary/Chest: Effort normal.  Neurological: She is alert and oriented to person, place, and time.  Skin: Skin is warm and dry. No erythema.    ED Course  Procedures (including critical care time)  Patient given treatment for lice. Bactrim and Ivermectin lotion. Bactrim inhibits Vit B production in the gut of lice and thus kills the lice  MDM          Carlyle Dolly, PA-C 08/10/12 1539

## 2012-08-09 NOTE — ED Notes (Signed)
Pt states she visited her niece and about 2 weeks ago her head started itching but she did not know what it was until about 1 week ago. Tried a treatment on Friday with not relief. States she has lice. Small white dots noted in pt head moving.

## 2012-08-12 NOTE — ED Provider Notes (Signed)
History/physical exam/procedure(s) were performed by non-physician practitioner and as supervising physician I was immediately available for consultation/collaboration. I have reviewed all notes and am in agreement with care and plan.   Hilario Quarry, MD 08/12/12 [phone]

## 2012-11-04 ENCOUNTER — Emergency Department (HOSPITAL_COMMUNITY)
Admission: EM | Admit: 2012-11-04 | Discharge: 2012-11-04 | Disposition: A | Discharge status: Home / Self Care | Payer: Medicaid Other | Attending: Emergency Medicine | Admitting: Emergency Medicine

## 2012-11-04 ENCOUNTER — Emergency Department (HOSPITAL_COMMUNITY): Payer: Medicaid Other

## 2012-11-04 ENCOUNTER — Encounter (HOSPITAL_COMMUNITY): Payer: Self-pay | Admitting: Emergency Medicine

## 2012-11-04 DIAGNOSIS — M25559 Pain in unspecified hip: Secondary | ICD-10-CM | POA: Insufficient documentation

## 2012-11-04 DIAGNOSIS — Z79899 Other long term (current) drug therapy: Secondary | ICD-10-CM | POA: Insufficient documentation

## 2012-11-04 DIAGNOSIS — F17200 Nicotine dependence, unspecified, uncomplicated: Secondary | ICD-10-CM | POA: Insufficient documentation

## 2012-11-04 DIAGNOSIS — Z8709 Personal history of other diseases of the respiratory system: Secondary | ICD-10-CM | POA: Insufficient documentation

## 2012-11-04 DIAGNOSIS — F319 Bipolar disorder, unspecified: Secondary | ICD-10-CM | POA: Insufficient documentation

## 2012-11-04 DIAGNOSIS — R52 Pain, unspecified: Secondary | ICD-10-CM | POA: Insufficient documentation

## 2012-11-04 DIAGNOSIS — M25551 Pain in right hip: Secondary | ICD-10-CM

## 2012-11-04 LAB — BASIC METABOLIC PANEL
BUN: 11 mg/dL (ref 6–23)
CO2: 23 mEq/L (ref 19–32)
Calcium: 9.6 mg/dL (ref 8.4–10.5)
Chloride: 104 mEq/L (ref 96–112)
Creatinine, Ser: 0.85 mg/dL (ref 0.50–1.10)
Glucose, Bld: 126 mg/dL — ABNORMAL HIGH (ref 70–99)

## 2012-11-04 LAB — CBC WITH DIFFERENTIAL/PLATELET
Basophils Absolute: 0 10*3/uL (ref 0.0–0.1)
Eosinophils Absolute: 0 10*3/uL (ref 0.0–0.7)
Eosinophils Relative: 0 % (ref 0–5)
HCT: 42.2 % (ref 36.0–46.0)
MCH: 33.2 pg (ref 26.0–34.0)
MCHC: 35.5 g/dL (ref 30.0–36.0)
MCV: 93.4 fL (ref 78.0–100.0)
Monocytes Absolute: 0.6 10*3/uL (ref 0.1–1.0)
Platelets: 165 10*3/uL (ref 150–400)
RDW: 12.6 % (ref 11.5–15.5)

## 2012-11-04 LAB — URINE MICROSCOPIC-ADD ON

## 2012-11-04 LAB — URINALYSIS, ROUTINE W REFLEX MICROSCOPIC
Bilirubin Urine: NEGATIVE
Glucose, UA: NEGATIVE mg/dL
Ketones, ur: NEGATIVE mg/dL
Nitrite: NEGATIVE
Protein, ur: NEGATIVE mg/dL

## 2012-11-04 MED ORDER — HYDROMORPHONE HCL PF 1 MG/ML IJ SOLN
1.0000 mg | Freq: Once | INTRAMUSCULAR | Status: AC
  Administered 2012-11-04: 1 mg via INTRAMUSCULAR
  Filled 2012-11-04: qty 1

## 2012-11-04 MED ORDER — ALBUTEROL SULFATE HFA 108 (90 BASE) MCG/ACT IN AERS
2.0000 | INHALATION_SPRAY | Freq: Four times a day (QID) | RESPIRATORY_TRACT | Status: DC
  Administered 2012-11-04: 2 via RESPIRATORY_TRACT
  Filled 2012-11-04: qty 6.7

## 2012-11-04 MED ORDER — HYDROCODONE-ACETAMINOPHEN 5-325 MG PO TABS: 1.0000 | ORAL_TABLET | Freq: Three times a day (TID) | ORAL | Status: DC | PRN

## 2012-11-04 MED ORDER — KETOROLAC TROMETHAMINE 30 MG/ML IJ SOLN
30.0000 mg | Freq: Once | INTRAMUSCULAR | Status: AC
  Administered 2012-11-04: 30 mg via INTRAMUSCULAR
  Filled 2012-11-04: qty 1

## 2012-11-04 NOTE — ED Notes (Signed)
Patient waiting to speak to Jeraldine Loots, MD.

## 2012-11-04 NOTE — ED Notes (Signed)
Patient aware of need for urine specimen.

## 2012-11-04 NOTE — ED Notes (Signed)
Patient with history of COPD and bipolar disorder, and family history of RA, presents to ED with 10/10 joint pain beginning 2 months ago and worsening severely over the past 2 weeks. Patient reports that she has been unable to eat and that her hands have felt numb for the past 3 months. Patient denies numbness in feet.

## 2012-11-04 NOTE — ED Provider Notes (Addendum)
History    CSN: [id number] Arrival date & time 11/04/12  0747  First MD Initiated Contact with Patient 11/04/12 [phone]     Chief Complaint  Patient presents with  . Joint Pain   (Consider location/radiation/quality/duration/timing/severity/associated sxs/prior Treatment) HPI  Patient presents with pain in her joints diffusely, and with increasing pain in her right hip. Pain has become severe over the past 2 weeks focally about the right lateral hip.  Pain is worse with motion. Pain is minimally improved with anything. Notable, prior to the onset of right hip pain severity, the patient received a corticosteroid injection in her left hip. She denies significant ongoing left hip pain currently. No current fever, chills, nausea, vomiting, diarrhea.   Past Medical History  Diagnosis Date  . Bipolar 1 disorder   . Emphysema   . Polysubstance abuse   . Bronchitis   . Emphysema    Past Surgical History  Procedure Laterality Date  . Abdominal hysterectomy    . Knee surgery      laser to left  . Appendectomy     Family History  Problem Relation Age of Onset  . Rheum arthritis Mother   . Cancer Mother   . Diabetes Mother   . Hypertension Mother   . Rheum arthritis Father   . Cancer Father   . Diabetes Father   . Hypertension Father    History  Substance Use Topics  . Smoking status: Current Every Day Smoker -- 0.50 packs/day  . Smokeless tobacco: Never Used  . Alcohol Use: No   OB History   Grav Para Term Preterm Abortions TAB SAB Ect Mult Living                 Review of Systems  All other systems reviewed and are negative.    Allergies  Review of patient's allergies indicates no known allergies.  Home Medications   Current Outpatient Rx  Name  Route  Sig  Dispense  Refill  . albuterol (PROVENTIL HFA;VENTOLIN HFA) 108 (90 BASE) MCG/ACT inhaler   Inhalation   Inhale 2 puffs into the lungs every 6 (six) hours as needed. shortness of breath         .  divalproex (DEPAKOTE) 500 MG DR tablet   Oral   Take 500 mg by mouth every morning.          . ferrous sulfate 325 (65 FE) MG tablet   Oral   Take 325 mg by mouth daily as needed. When energy is low         . ibuprofen (ADVIL,MOTRIN) 200 MG tablet   Oral   Take 200 mg by mouth every 8 (eight) hours as needed. For pain         . Ivermectin 0.5 % LOTN   Apply externally   Apply 1 application topically once. Apply to scalp and hair. Wash off after 10 minutes   1 Tube   0   . lithium carbonate 150 MG capsule   Oral   Take 150 mg by mouth 3 (three) times daily with meals.          . Multiple Vitamin (MULTIVITAMIN WITH MINERALS) TABS   Oral   Take 1 tablet by mouth every morning.         Marland Kitchen QUEtiapine (SEROQUEL) 300 MG tablet   Oral   Take 300 mg by mouth at bedtime.          . vitamin B-12 (CYANOCOBALAMIN) 1000 MCG  tablet   Oral   Take 1,000 mcg by mouth daily.          BP 126/85  Pulse 101  Temp(Src) 98.5 F (36.9 C)  Resp 20  Ht 5\' 9"  (1.753 m)  Wt 175 lb (79.379 kg)  BMI 25.83 kg/m2  SpO2 95% Physical Exam  Nursing note and vitals reviewed. Constitutional: She is oriented to person, place, and time. She appears well-developed and well-nourished. No distress.  HENT:  Head: Normocephalic and atraumatic.  Eyes: Conjunctivae and EOM are normal.  Cardiovascular: Normal rate and regular rhythm.   Pulmonary/Chest: Effort normal and breath sounds normal. No stridor. No respiratory distress.  Abdominal: She exhibits no distension.  Musculoskeletal: She exhibits no edema.  Antalgic gait, but maes w appropriate strength.  TTP about the R lateral hip w no deformity  Neurological: She is alert and oriented to person, place, and time. No cranial nerve deficit.  Skin: Skin is warm and dry.  Psychiatric: She has a normal mood and affect.    ED Course  Procedures (including critical care time) Labs Reviewed  BASIC METABOLIC PANEL - Abnormal; Notable for the  following:    Glucose, Bld 126 (*)    GFR calc non Af Amer 76 (*)    GFR calc Af Amer 88 (*)    All other components within normal limits  CBC WITH DIFFERENTIAL - Abnormal; Notable for the following:    WBC 10.6 (*)    Neutro Abs 8.2 (*)    All other components within normal limits  URINALYSIS, ROUTINE W REFLEX MICROSCOPIC   Dg Hip Complete Right  11/04/2012   *RADIOLOGY REPORT*  Clinical Data: Right hip pain  RIGHT HIP - COMPLETE 2+ VIEW  Comparison: None.  Findings: Negative for fracture.  Normal alignment and no significant degenerative change in the right hip joint.  IMPRESSION: Negative   Original Report Authenticated By: Janeece Riggers, M.D.   No diagnosis found.   9:56 AM Patient is ambulatory.  I reviewed her chart.  Patient requests albuterol inhaler to go - she had no wheezing,  this was provided MDM  Patient presents with increasing pain in her right hip.  The patient is ambulatory, and although there is pain with range of motion, she has appropriate strength, is neurovascularly intact, and is in no distress.  There is low suspicion for occult septic arthritis or other systemic infection.  The patient's x-rays do not demonstrate acute findings, and she was appropriate for discharge with close orthopedic followup.  Gerhard Munch, MD 11/04/12 1478  Gerhard Munch, MD 11/04/12 1045

## 2012-11-04 NOTE — ED Notes (Signed)
Patient transported to X-ray 

## 2012-11-09 ENCOUNTER — Emergency Department (HOSPITAL_COMMUNITY)
Admission: EM | Admit: 2012-11-09 | Discharge: 2012-11-10 | Disposition: A | Discharge status: Home / Self Care | Payer: Medicaid Other | Attending: Emergency Medicine | Admitting: Emergency Medicine

## 2012-11-09 ENCOUNTER — Encounter (HOSPITAL_COMMUNITY): Payer: Self-pay

## 2012-11-09 DIAGNOSIS — Z79899 Other long term (current) drug therapy: Secondary | ICD-10-CM | POA: Insufficient documentation

## 2012-11-09 DIAGNOSIS — Z9089 Acquired absence of other organs: Secondary | ICD-10-CM | POA: Insufficient documentation

## 2012-11-09 DIAGNOSIS — Z8709 Personal history of other diseases of the respiratory system: Secondary | ICD-10-CM | POA: Insufficient documentation

## 2012-11-09 DIAGNOSIS — F17200 Nicotine dependence, unspecified, uncomplicated: Secondary | ICD-10-CM | POA: Insufficient documentation

## 2012-11-09 DIAGNOSIS — M25559 Pain in unspecified hip: Secondary | ICD-10-CM | POA: Insufficient documentation

## 2012-11-09 DIAGNOSIS — M545 Low back pain, unspecified: Secondary | ICD-10-CM | POA: Insufficient documentation

## 2012-11-09 DIAGNOSIS — Z8659 Personal history of other mental and behavioral disorders: Secondary | ICD-10-CM | POA: Insufficient documentation

## 2012-11-09 DIAGNOSIS — M25551 Pain in right hip: Secondary | ICD-10-CM

## 2012-11-09 DIAGNOSIS — F319 Bipolar disorder, unspecified: Secondary | ICD-10-CM | POA: Insufficient documentation

## 2012-11-09 DIAGNOSIS — M79609 Pain in unspecified limb: Secondary | ICD-10-CM | POA: Insufficient documentation

## 2012-11-09 DIAGNOSIS — M25552 Pain in left hip: Secondary | ICD-10-CM

## 2012-11-09 MED ORDER — LORAZEPAM 2 MG/ML IJ SOLN
1.0000 mg | Freq: Once | INTRAMUSCULAR | Status: DC
  Filled 2012-11-09: qty 1

## 2012-11-09 MED ORDER — KETOROLAC TROMETHAMINE 30 MG/ML IJ SOLN
30.0000 mg | Freq: Once | INTRAMUSCULAR | Status: DC
  Filled 2012-11-09: qty 1

## 2012-11-09 MED ORDER — SODIUM CHLORIDE 0.9 % IV BOLUS (SEPSIS): 1000.0000 mL | Freq: Once | INTRAVENOUS | Status: DC

## 2012-11-09 MED ORDER — METHYLPREDNISOLONE SODIUM SUCC 125 MG IJ SOLR
125.0000 mg | Freq: Once | INTRAMUSCULAR | Status: DC
  Filled 2012-11-09: qty 2

## 2012-11-09 NOTE — ED Notes (Signed)
[id number]<AV> Expected date:11/09/12<BR> Expected time:10:32 PM<BR> Means of arrival:Ambulance<BR> Comments:<BR> Generalized pain

## 2012-11-09 NOTE — ED Notes (Signed)
Pt presents with EMS with c/o generalized pain. Pt was seen on the 17th of this month and d/c with joint pain. Per EMS, pt was angry, agitated, and uncooperative en route.

## 2012-11-10 MED ORDER — OXYCODONE-ACETAMINOPHEN 5-325 MG PO TABS
2.0000 | ORAL_TABLET | Freq: Once | ORAL | Status: AC
  Administered 2012-11-10: 2 via ORAL
  Filled 2012-11-10: qty 2

## 2012-11-10 MED ORDER — OXYCODONE-ACETAMINOPHEN 5-325 MG PO TABS: 2.0000 | ORAL_TABLET | ORAL | Status: DC | PRN

## 2012-11-10 MED ORDER — LORAZEPAM 1 MG PO TABS
1.0000 mg | ORAL_TABLET | Freq: Once | ORAL | Status: AC
  Administered 2012-11-10: 1 mg via ORAL
  Filled 2012-11-10: qty 1

## 2012-11-10 MED ORDER — PREDNISONE 20 MG PO TABS
60.0000 mg | ORAL_TABLET | Freq: Once | ORAL | Status: AC
  Administered 2012-11-10: 60 mg via ORAL
  Filled 2012-11-10: qty 3

## 2012-11-10 MED ORDER — PREDNISONE 20 MG PO TABS: ORAL_TABLET | ORAL | Status: DC

## 2012-11-10 MED ORDER — LORAZEPAM 1 MG PO TABS: 1.0000 mg | ORAL_TABLET | Freq: Three times a day (TID) | ORAL | Status: DC | PRN

## 2012-11-10 NOTE — ED Notes (Signed)
Police and security still outside of pt's room. Spoke with Dr. Adriana Simas and Dr. Adriana Simas says the patient may take the PO medicine at this time and if pt refuses and continues to cause a scene, she may leave the ED AMA. Police at bedside and made pt aware of the situation with this RN. Pt still yelling and raising her voice. PO medicine given.

## 2012-11-10 NOTE — ED Notes (Addendum)
Patient given ginger ale to drink 

## 2012-11-10 NOTE — ED Provider Notes (Signed)
History    CSN: [id number] Arrival date & time 11/09/12  2249  First MD Initiated Contact with Patient 11/09/12 2316     Chief Complaint  Patient presents with  . Generalized Body Aches   (Consider location/radiation/quality/duration/timing/severity/associated sxs/prior Treatment) HPI .Marland Kitchen... patient complains of generalized long-standing pain particularly in her low back, hips,  and lower extremity.   She has had both primary care and orthopedic followup for these problems.   This is not a new problem.  Patient is ambulatory without obvious neurological deficits. No bowel or bladder incontinence.  She is very talkative  Past Medical History  Diagnosis Date  . Bipolar 1 disorder   . Emphysema   . Polysubstance abuse   . Bronchitis   . Emphysema    Past Surgical History  Procedure Laterality Date  . Abdominal hysterectomy    . Knee surgery      laser to left  . Appendectomy     Family History  Problem Relation Age of Onset  . Rheum arthritis Mother   . Cancer Mother   . Diabetes Mother   . Hypertension Mother   . Rheum arthritis Father   . Cancer Father   . Diabetes Father   . Hypertension Father    History  Substance Use Topics  . Smoking status: Current Every Day Smoker -- 0.50 packs/day  . Smokeless tobacco: Never Used  . Alcohol Use: No   OB History   Grav Para Term Preterm Abortions TAB SAB Ect Mult Living                 Review of Systems  All other systems reviewed and are negative.    Allergies  Review of patient's allergies indicates no known allergies.  Home Medications   Current Outpatient Rx  Name  Route  Sig  Dispense  Refill  . albuterol (PROVENTIL HFA;VENTOLIN HFA) 108 (90 BASE) MCG/ACT inhaler   Inhalation   Inhale 2 puffs into the lungs every 6 (six) hours as needed. shortness of breath         . divalproex (DEPAKOTE) 500 MG DR tablet   Oral   Take 500 mg by mouth every morning.          . ferrous sulfate 325 (65 FE) MG  tablet   Oral   Take 325 mg by mouth daily as needed. When energy is low         . HYDROcodone-acetaminophen (NORCO/VICODIN) 5-325 MG per tablet   Oral   Take 1 tablet by mouth every 8 (eight) hours as needed for pain.   15 tablet   0   . ibuprofen (ADVIL,MOTRIN) 200 MG tablet   Oral   Take 200 mg by mouth every 8 (eight) hours as needed. For pain         . Ivermectin 0.5 % LOTN   Apply externally   Apply 1 application topically once. Apply to scalp and hair. Wash off after 10 minutes   1 Tube   0   . lithium carbonate 150 MG capsule   Oral   Take 150 mg by mouth 3 (three) times daily with meals.          Marland Kitchen LORazepam (ATIVAN) 1 MG tablet   Oral   Take 1 tablet (1 mg total) by mouth 3 (three) times daily as needed for anxiety.   15 tablet   0   . Multiple Vitamin (MULTIVITAMIN WITH MINERALS) TABS   Oral  Take 1 tablet by mouth every morning.         Marland Kitchen oxyCODONE-acetaminophen (PERCOCET) 5-325 MG per tablet   Oral   Take 2 tablets by mouth every 4 (four) hours as needed for pain.   15 tablet   0   . predniSONE (DELTASONE) 20 MG tablet      3 tabs po day one, then 2 po daily x 4 days   11 tablet   0   . QUEtiapine (SEROQUEL) 300 MG tablet   Oral   Take 300 mg by mouth at bedtime.          . vitamin B-12 (CYANOCOBALAMIN) 1000 MCG tablet   Oral   Take 1,000 mcg by mouth daily.          BP 140/85  Pulse 116  Temp(Src) 97.9 F (36.6 C) (Oral)  Resp 22  SpO2 98% Physical Exam  Nursing note and vitals reviewed. Constitutional: She is oriented to person, place, and time.  Patient is ambulatory  HENT:  Head: Normocephalic and atraumatic.  Eyes: Conjunctivae and EOM are normal. Pupils are equal, round, and reactive to light.  Neck: Normal range of motion. Neck supple.  Cardiovascular: Normal rate, regular rhythm and normal heart sounds.   Pulmonary/Chest: Effort normal and breath sounds normal.  Abdominal: Soft. Bowel sounds are normal.   Musculoskeletal:  Minimally tender to palpation in the lumbar spine and bilaterally in the hips  Neurological: She is alert and oriented to person, place, and time.  Skin: Skin is warm and dry.  Psychiatric: She has a normal mood and affect.    ED Course  Procedures (including critical care time) Labs Reviewed - No data to display No results found. 1. Hip pain, left   2. Hip pain, right     MDM  Patient has chronic pain issues. Encouraged primary care follow up.   Discharge meds prednisone, percocet, ativan  Donnetta Hutching, MD 11/10/12 [phone]

## 2012-11-10 NOTE — ED Notes (Signed)
Upon d/c pt says she will not leave until she speaks with Dr. Adriana Simas. Police and RN at bedside to make pt aware that I will go over her paperwork and that Dr. Adriana Simas has already spoken to her and he will not be back in the room to talk to her. Pt continuing to scream and raise her voice at this RN. Pt refusing to come back into the room to review discharge paperwork and repeatedly saying that she will be checking right back in after discharge. Pt making threats saying that she will find out all about me and that it "is not a threat, it is a promise". Charge RN Renaldo Fiddler made aware.
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
## 2012-11-16 ENCOUNTER — Emergency Department (HOSPITAL_COMMUNITY)
Admission: EM | Admit: 2012-11-16 | Discharge: 2012-11-16 | Disposition: A | Discharge status: Home / Self Care | Payer: Medicaid Other | Attending: Emergency Medicine | Admitting: Emergency Medicine

## 2012-11-16 ENCOUNTER — Encounter (HOSPITAL_COMMUNITY): Payer: Self-pay | Admitting: *Deleted

## 2012-11-16 DIAGNOSIS — Z79899 Other long term (current) drug therapy: Secondary | ICD-10-CM | POA: Insufficient documentation

## 2012-11-16 DIAGNOSIS — F319 Bipolar disorder, unspecified: Secondary | ICD-10-CM | POA: Insufficient documentation

## 2012-11-16 DIAGNOSIS — F17200 Nicotine dependence, unspecified, uncomplicated: Secondary | ICD-10-CM | POA: Insufficient documentation

## 2012-11-16 DIAGNOSIS — Z8709 Personal history of other diseases of the respiratory system: Secondary | ICD-10-CM | POA: Insufficient documentation

## 2012-11-16 DIAGNOSIS — M2550 Pain in unspecified joint: Secondary | ICD-10-CM | POA: Insufficient documentation

## 2012-11-16 DIAGNOSIS — M2540 Effusion, unspecified joint: Secondary | ICD-10-CM | POA: Insufficient documentation

## 2012-11-16 DIAGNOSIS — M25559 Pain in unspecified hip: Secondary | ICD-10-CM | POA: Insufficient documentation

## 2012-11-16 DIAGNOSIS — G8929 Other chronic pain: Secondary | ICD-10-CM | POA: Insufficient documentation

## 2012-11-16 MED ORDER — DEXAMETHASONE SODIUM PHOSPHATE 4 MG/ML IJ SOLN
8.0000 mg | Freq: Once | INTRAMUSCULAR | Status: AC
  Administered 2012-11-16: 8 mg via INTRAMUSCULAR
  Filled 2012-11-16: qty 2

## 2012-11-16 MED ORDER — OXYCODONE-ACETAMINOPHEN 5-325 MG PO TABS: 1.0000 | ORAL_TABLET | ORAL | Status: DC | PRN

## 2012-11-16 MED ORDER — HYDROMORPHONE HCL PF 1 MG/ML IJ SOLN
1.0000 mg | Freq: Once | INTRAMUSCULAR | Status: AC
  Administered 2012-11-16: 1 mg via INTRAMUSCULAR
  Filled 2012-11-16: qty 1

## 2012-11-16 NOTE — ED Notes (Signed)
Pt up and able to ambulate to the bathroom without assistance. Pt sleeping during discharge, had to be awakened to continue with instructions.

## 2012-11-16 NOTE — ED Notes (Signed)
Pain to right hip x 1 wk - seen at [facility] for same on 11/09/12.  Given rx for pain, reports meds aren't working.  Requesting something stronger.

## 2012-11-18 ENCOUNTER — Emergency Department (HOSPITAL_COMMUNITY)
Admission: EM | Admit: 2012-11-18 | Discharge: 2012-11-19 | Disposition: A | Discharge status: Other Acute Inpatient Hospital | Payer: Medicaid Other | Attending: Psychiatry | Admitting: Psychiatry

## 2012-11-18 ENCOUNTER — Encounter (HOSPITAL_COMMUNITY): Payer: Self-pay | Admitting: *Deleted

## 2012-11-18 DIAGNOSIS — J438 Other emphysema: Secondary | ICD-10-CM | POA: Insufficient documentation

## 2012-11-18 DIAGNOSIS — Z79899 Other long term (current) drug therapy: Secondary | ICD-10-CM | POA: Insufficient documentation

## 2012-11-18 DIAGNOSIS — F17200 Nicotine dependence, unspecified, uncomplicated: Secondary | ICD-10-CM | POA: Insufficient documentation

## 2012-11-18 DIAGNOSIS — Z3202 Encounter for pregnancy test, result negative: Secondary | ICD-10-CM | POA: Insufficient documentation

## 2012-11-18 DIAGNOSIS — IMO0002 Reserved for concepts with insufficient information to code with codable children: Secondary | ICD-10-CM | POA: Insufficient documentation

## 2012-11-18 DIAGNOSIS — F309 Manic episode, unspecified: Secondary | ICD-10-CM | POA: Insufficient documentation

## 2012-11-18 DIAGNOSIS — M129 Arthropathy, unspecified: Secondary | ICD-10-CM | POA: Insufficient documentation

## 2012-11-18 LAB — RAPID URINE DRUG SCREEN, HOSP PERFORMED
Amphetamines: NOT DETECTED
Barbiturates: NOT DETECTED
Benzodiazepines: NOT DETECTED
Cocaine: NOT DETECTED
Opiates: NOT DETECTED
Tetrahydrocannabinol: POSITIVE — AB

## 2012-11-18 LAB — ACETAMINOPHEN LEVEL: Acetaminophen (Tylenol), Serum: <15 ug/mL (ref 10–30)

## 2012-11-18 LAB — COMPREHENSIVE METABOLIC PANEL
ALT: 48 U/L — ABNORMAL HIGH (ref 0–35)
AST: 53 U/L — ABNORMAL HIGH (ref 0–37)
Alkaline Phosphatase: 51 U/L (ref 39–117)
Calcium: 9.5 mg/dL (ref 8.4–10.5)
Potassium: 3.8 mEq/L (ref 3.5–5.1)
Sodium: 140 mEq/L (ref 135–145)
Total Protein: 6.7 g/dL (ref 6.0–8.3)

## 2012-11-18 LAB — CBC
MCH: 32.3 pg (ref 26.0–34.0)
MCHC: 34 g/dL (ref 30.0–36.0)
Platelets: 187 10*3/uL (ref 150–400)
RBC: 4.24 MIL/uL (ref 3.87–5.11)

## 2012-11-18 LAB — URINALYSIS, ROUTINE W REFLEX MICROSCOPIC
Bilirubin Urine: NEGATIVE
Glucose, UA: NEGATIVE mg/dL
Hgb urine dipstick: NEGATIVE
Ketones, ur: NEGATIVE mg/dL
Protein, ur: NEGATIVE mg/dL

## 2012-11-18 LAB — POCT PREGNANCY, URINE: Preg Test, Ur: NEGATIVE

## 2012-11-18 MED ORDER — ZIPRASIDONE MESYLATE 20 MG IM SOLR
INTRAMUSCULAR | Status: AC
  Filled 2012-11-18: qty 20

## 2012-11-18 MED ORDER — ZIPRASIDONE MESYLATE 20 MG IM SOLR
20.0000 mg | Freq: Once | INTRAMUSCULAR | Status: AC
  Administered 2012-11-18: 20 mg via INTRAMUSCULAR

## 2012-11-18 MED ORDER — LORAZEPAM 1 MG PO TABS
1.0000 mg | ORAL_TABLET | Freq: Once | ORAL | Status: AC
  Administered 2012-11-18: 1 mg via ORAL
  Filled 2012-11-18: qty 1

## 2012-11-18 NOTE — ED Notes (Signed)
Pt is very loud speaking about god and the bible and her family and her friends pt is talking in circles crying one minute and talking the next. I have made multiple attempts to call information with the intentions to get her friends phone number pt has gave me at least 10 different addresses and names to check for her with no success. Aashir Umholtz

## 2012-11-18 NOTE — ED Notes (Addendum)
Security called because pt is refusing to give up her pocket book. Pt continues to talk very rapidly (word salad) Pt keeps asking [place] to call a Jodi Mourning but she does not have her number.

## 2012-11-18 NOTE — ED Notes (Addendum)
Patient is very loud and anxious at this time. Patient is upset that she can not keep her belongings in the room. Wants staff to call her friend to come to be with her but can not remember the number. Patient is tearful at this time.

## 2012-11-18 NOTE — ED Provider Notes (Signed)
CSN: [id number]     Arrival date & time 11/18/12  1951 History     First MD Initiated Contact with Patient 11/18/12 2011     Chief Complaint  Patient presents with  . Extremity Pain   (Consider location/radiation/quality/duration/timing/severity/associated sxs/prior Treatment) HPI Comments: 52 yo female with hx of bipolar presenting via EMS complaining of all over arthritis pain.  History is limited secondary to her pressured speech and tangential thought process.    Patient is a 52 y.o. female presenting with extremity pain. The history is provided by the patient.  Extremity Pain This is a chronic problem. The current episode started more than 1 week ago. The problem occurs constantly. The problem has been gradually worsening. Pertinent negatives include no chest pain, no abdominal pain and no shortness of breath. The symptoms are aggravated by walking. Relieved by: percocet.    Past Medical History  Diagnosis Date  . Bipolar 1 disorder   . Emphysema   . Polysubstance abuse   . Bronchitis   . Emphysema    Past Surgical History  Procedure Laterality Date  . Abdominal hysterectomy    . Knee surgery      laser to left  . Appendectomy     Family History  Problem Relation Age of Onset  . Rheum arthritis Mother   . Cancer Mother   . Diabetes Mother   . Hypertension Mother   . Rheum arthritis Father   . Cancer Father   . Diabetes Father   . Hypertension Father    History  Substance Use Topics  . Smoking status: Current Every Day Smoker -- 0.50 packs/day  . Smokeless tobacco: Never Used  . Alcohol Use: No   OB History   Grav Para Term Preterm Abortions TAB SAB Ect Mult Living                 Review of Systems  Constitutional: Negative for fever.  HENT: Negative for congestion.   Respiratory: Negative for cough and shortness of breath.   Cardiovascular: Negative for chest pain.  Gastrointestinal: Negative for nausea, vomiting, abdominal pain and diarrhea.  All  other systems reviewed and are negative.    Allergies  Cortisone  Home Medications   Current Outpatient Rx  Name  Route  Sig  Dispense  Refill  . divalproex (DEPAKOTE) 500 MG DR tablet   Oral   Take 500 mg by mouth every morning.          . ferrous sulfate 325 (65 FE) MG tablet   Oral   Take 325 mg by mouth daily as needed. When energy is low         . Fluticasone-Salmeterol (ADVAIR) 100-50 MCG/DOSE AEPB   Inhalation   Inhale 1 puff into the lungs every 12 (twelve) hours.         Marland Kitchen HYDROcodone-acetaminophen (NORCO/VICODIN) 5-325 MG per tablet   Oral   Take 1 tablet by mouth every 8 (eight) hours as needed for pain.   15 tablet   0   . ibuprofen (ADVIL,MOTRIN) 200 MG tablet   Oral   Take 200 mg by mouth every 8 (eight) hours as needed. For pain         . lithium carbonate 150 MG capsule   Oral   Take 150 mg by mouth 3 (three) times daily with meals.          Marland Kitchen LORazepam (ATIVAN) 1 MG tablet   Oral   Take 1 tablet (1  mg total) by mouth 3 (three) times daily as needed for anxiety.   15 tablet   0   . Multiple Vitamin (MULTIVITAMIN WITH MINERALS) TABS   Oral   Take 1 tablet by mouth every morning.         Marland Kitchen oxyCODONE-acetaminophen (PERCOCET) 5-325 MG per tablet   Oral   Take 2 tablets by mouth every 4 (four) hours as needed for pain.   15 tablet   0   . oxyCODONE-acetaminophen (PERCOCET/ROXICET) 5-325 MG per tablet   Oral   Take 1 tablet by mouth every 4 (four) hours as needed for pain.   15 tablet   0   . predniSONE (DELTASONE) 20 MG tablet      3 tabs po day one, then 2 po daily x 4 days   11 tablet   0   . QUEtiapine (SEROQUEL) 300 MG tablet   Oral   Take 150-300 mg by mouth at bedtime.          . vitamin B-12 (CYANOCOBALAMIN) 1000 MCG tablet   Oral   Take 1,000 mcg by mouth daily.          BP 133/84  Pulse 122  Temp(Src) 98.4 F (36.9 C) (Oral)  Resp 24  Ht 5\' 10"  (1.778 m)  Wt 170 lb (77.111 kg)  BMI 24.39 kg/m2  SpO2  100% Physical Exam  Nursing note and vitals reviewed. Constitutional: She is oriented to person, place, and time. She appears well-developed and well-nourished. No distress.  unkempt  HENT:  Head: Normocephalic and atraumatic.  Mouth/Throat: Oropharynx is clear and moist.  Eyes: Conjunctivae are normal. Pupils are equal, round, and reactive to light. No scleral icterus.  Neck: Neck supple.  Cardiovascular: Normal rate, regular rhythm, normal heart sounds and intact distal pulses.   No murmur heard. Pulmonary/Chest: Effort normal and breath sounds normal. No stridor. No respiratory distress. She has no rales.  Abdominal: Soft. Bowel sounds are normal. She exhibits no distension. There is no tenderness.  Musculoskeletal: Normal range of motion.  No apparent pain to palpation or ROM of hips or knees.  2+ distal pulses.  Normal gait.    Neurological: She is alert and oriented to person, place, and time. Gait normal.  Skin: Skin is warm and dry. No rash noted.  Psychiatric: Her mood appears anxious. Her speech is rapid and/or pressured and tangential. She is hyperactive. Thought content is not paranoid. She expresses no homicidal and no suicidal ideation.    ED Course   Procedures (including critical care time)  Labs Reviewed  CBC - Abnormal; Notable for the following:    WBC 11.0 (*)    All other components within normal limits  COMPREHENSIVE METABOLIC PANEL - Abnormal; Notable for the following:    Glucose, Bld 112 (*)    AST 53 (*)    ALT 48 (*)    GFR calc non Af Amer 67 (*)    GFR calc Af Amer 77 (*)    All other components within normal limits  SALICYLATE LEVEL - Abnormal; Notable for the following:    Salicylate Lvl <2.0 (*)    All other components within normal limits  URINE RAPID DRUG SCREEN (HOSP PERFORMED) - Abnormal; Notable for the following:    Tetrahydrocannabinol POSITIVE (*)    All other components within normal limits  LITHIUM LEVEL - Abnormal; Notable for the  following:    Lithium Lvl <0.25 (*)    All other components within normal  limits  URINALYSIS, ROUTINE W REFLEX MICROSCOPIC - Abnormal; Notable for the following:    Color, Urine STRAW (*)    Specific Gravity, Urine <1.005 (*)    All other components within normal limits  ACETAMINOPHEN LEVEL  ETHANOL  POCT PREGNANCY, URINE   No results found. 1. Mania     MDM  9:13 PM 52 yo female presenting with chief complaint of arthritis pain, but exam more concerning for acute mania.  She has exhibited some bizarre behavior while in ED.  She locked herself in the bathroom for an extended period of time, stating "I'm dirty, I need to clean up for the doctor".  She also provided a urine sample to nursing staff that was cold to the touch.  She reports compliance with her lithium.  She denies SI/HI or hallucinations.  Psych panel and telepsych ordered.  She agrees to this plan.    Care transferred to Dr. Juleen China with telepsych pending.    Candyce Churn, MD 11/19/12 [phone]

## 2012-11-18 NOTE — ED Provider Notes (Signed)
CSN: [id number]     Arrival date & time 11/16/12  1736 History     First MD Initiated Contact with Patient 11/16/12 1748     Chief Complaint  Patient presents with  . Hip Pain   (Consider location/radiation/quality/duration/timing/severity/associated sxs/prior Treatment) HPI Comments: [name] is a 52 y.o. Female presenting with acute on chronic right hip pain. She reports having been seen by Dr. Magnus Ivan 4 days ago and advised she needs a right hip replacement, which has been scheduled for the end of next week.  In the interim,  She has increased pain in the hip.  She denies injury in the joint. She was seen at [facility] last week and was prescribed oxycodone which she is know out of,  Although states it did not relieve her pain and would prefer stronger pain medicine.  She denies radiation of pain which is constant and aching,  But worse with weight bearing and movement. She has had no fevers, chills, rash, numbness or weakness in her lower extremities,  Denies peripheral swelling.  She has had no medicines for her pain prior to arrival today.     The history is provided by the patient.    Past Medical History  Diagnosis Date  . Bipolar 1 disorder   . Emphysema   . Polysubstance abuse   . Bronchitis   . Emphysema    Past Surgical History  Procedure Laterality Date  . Abdominal hysterectomy    . Knee surgery      laser to left  . Appendectomy     Family History  Problem Relation Age of Onset  . Rheum arthritis Mother   . Cancer Mother   . Diabetes Mother   . Hypertension Mother   . Rheum arthritis Father   . Cancer Father   . Diabetes Father   . Hypertension Father    History  Substance Use Topics  . Smoking status: Current Every Day Smoker -- 0.50 packs/day  . Smokeless tobacco: Never Used  . Alcohol Use: No   OB History   Grav Para Term Preterm Abortions TAB SAB Ect Mult Living                 Review of Systems  Constitutional: Negative for fever.   Musculoskeletal: Positive for joint swelling and arthralgias. Negative for myalgias.  Neurological: Negative for weakness and numbness.    Allergies  Cortisone  Home Medications   Current Outpatient Rx  Name  Route  Sig  Dispense  Refill  . divalproex (DEPAKOTE) 500 MG DR tablet   Oral   Take 500 mg by mouth every morning.          . Fluticasone-Salmeterol (ADVAIR) 100-50 MCG/DOSE AEPB   Inhalation   Inhale 1 puff into the lungs every 12 (twelve) hours.         Marland Kitchen lithium carbonate 150 MG capsule   Oral   Take 150 mg by mouth 3 (three) times daily with meals.          . Multiple Vitamin (MULTIVITAMIN WITH MINERALS) TABS   Oral   Take 1 tablet by mouth every morning.         Marland Kitchen QUEtiapine (SEROQUEL) 300 MG tablet   Oral   Take 150-300 mg by mouth at bedtime.          . vitamin B-12 (CYANOCOBALAMIN) 1000 MCG tablet   Oral   Take 1,000 mcg by mouth daily.         Marland Kitchen  ferrous sulfate 325 (65 FE) MG tablet   Oral   Take 325 mg by mouth daily as needed. When energy is low         . HYDROcodone-acetaminophen (NORCO/VICODIN) 5-325 MG per tablet   Oral   Take 1 tablet by mouth every 8 (eight) hours as needed for pain.   15 tablet   0   . ibuprofen (ADVIL,MOTRIN) 200 MG tablet   Oral   Take 200 mg by mouth every 8 (eight) hours as needed. For pain         . LORazepam (ATIVAN) 1 MG tablet   Oral   Take 1 tablet (1 mg total) by mouth 3 (three) times daily as needed for anxiety.   15 tablet   0   . oxyCODONE-acetaminophen (PERCOCET) 5-325 MG per tablet   Oral   Take 2 tablets by mouth every 4 (four) hours as needed for pain.   15 tablet   0   . oxyCODONE-acetaminophen (PERCOCET/ROXICET) 5-325 MG per tablet   Oral   Take 1 tablet by mouth every 4 (four) hours as needed for pain.   15 tablet   0   . predniSONE (DELTASONE) 20 MG tablet      3 tabs po day one, then 2 po daily x 4 days   11 tablet   0    BP 128/79  Pulse 116  Temp(Src) 97.6 F  (36.4 C) (Oral)  Resp 22  Ht 5\' 10"  (1.778 m)  Wt 179 lb (81.194 kg)  BMI 25.68 kg/m2  SpO2 96% Physical Exam  Constitutional: She appears well-developed and well-nourished.  HENT:  Head: Atraumatic.  Neck: Normal range of motion.  Cardiovascular:  Pulses equal bilaterally  Musculoskeletal: She exhibits tenderness. She exhibits no edema.       Right hip: She exhibits normal range of motion, no swelling, no crepitus and no deformity.  ttp right lateral hip over greater trochanter.  No groin pain. Pain laterally with hip rotation.  Pedal pulses intact.  No peripheral edema.  Neurological: She is alert. She has normal strength. She displays normal reflexes. No sensory deficit.  Equal strength  Skin: Skin is warm and dry.  Psychiatric: She has a normal mood and affect.    ED Course   Procedures (including critical care time)  Labs Reviewed - No data to display No results found. 1. Chronic hip pain, right     MDM  Discussed xray findings (from last week) with patient,  Revealing no signs of arthritis or other obvious reason for hip surgery. She is unable to elaborate on her need for surgery.  Pt insistent that she is scheduled for surgery next week.  She does have a history of bipolar disease,  Does not appear to be manic, no flight of ideas,  Talkative,  But focused.  Discussed with Dr. Clarene Duke prior to dc home.  Prescribed oxycodone,  F/u with her orthopedist as planned.  Burgess Amor, PA-C 11/18/12 2019

## 2012-11-18 NOTE — ED Notes (Signed)
Went into room to assess pt. Pt states she was in the bathroom cleaning up. Pt has been in the bathroom over 10 minutes, will go back in and assess pt.

## 2012-11-18 NOTE — ED Notes (Addendum)
Pt presents via EMS secondary to " joint pain". Pt is noted crying, tearing a bag apart that is in her purse. Pt reports having surgery on Monday to have ALL JOINTS REPLACED. Pt is talking rapidly  Pt was picked up at [facility] for " diabetes c/o".  Pt denies diabetes history. Pt is noted having a handbag full of food, was asked not to eat it until dr sais it was ok. Pt verbalized understanding.

## 2012-11-19 ENCOUNTER — Inpatient Hospital Stay (HOSPITAL_COMMUNITY)
Admission: AD | Admit: 2012-11-19 | Discharge: 2012-11-26 | DRG: 885 | Disposition: A | Discharge status: Home / Self Care | Payer: Medicaid Other | Source: Intra-hospital | Attending: Psychiatry | Admitting: Psychiatry

## 2012-11-19 ENCOUNTER — Encounter (HOSPITAL_COMMUNITY): Payer: Self-pay | Admitting: *Deleted

## 2012-11-19 ENCOUNTER — Telehealth (HOSPITAL_COMMUNITY): Payer: Self-pay | Admitting: *Deleted

## 2012-11-19 ENCOUNTER — Encounter (HOSPITAL_COMMUNITY): Payer: Self-pay

## 2012-11-19 DIAGNOSIS — J438 Other emphysema: Secondary | ICD-10-CM | POA: Diagnosis present

## 2012-11-19 DIAGNOSIS — Z79899 Other long term (current) drug therapy: Secondary | ICD-10-CM

## 2012-11-19 DIAGNOSIS — I10 Essential (primary) hypertension: Secondary | ICD-10-CM | POA: Diagnosis present

## 2012-11-19 DIAGNOSIS — J45909 Unspecified asthma, uncomplicated: Secondary | ICD-10-CM | POA: Diagnosis present

## 2012-11-19 DIAGNOSIS — F3110 Bipolar disorder, current episode manic without psychotic features, unspecified: Principal | ICD-10-CM | POA: Diagnosis present

## 2012-11-19 MED ORDER — ALUM & MAG HYDROXIDE-SIMETH 200-200-20 MG/5ML PO SUSP: 30.0000 mL | ORAL | Status: DC | PRN

## 2012-11-19 MED ORDER — ZIPRASIDONE MESYLATE 20 MG IM SOLR
20.0000 mg | Freq: Two times a day (BID) | INTRAMUSCULAR | Status: DC | PRN
  Administered 2012-11-19: 20 mg via INTRAMUSCULAR
  Filled 2012-11-19: qty 20

## 2012-11-19 MED ORDER — LORAZEPAM 1 MG PO TABS
1.0000 mg | ORAL_TABLET | Freq: Three times a day (TID) | ORAL | Status: DC | PRN
Start: 2012-11-19 — End: 2012-11-19
  Administered 2012-11-19: 1 mg via ORAL
  Filled 2012-11-19: qty 1

## 2012-11-19 MED ORDER — QUETIAPINE FUMARATE 100 MG PO TABS
150.0000 mg | ORAL_TABLET | Freq: Every day | ORAL | Status: DC
  Administered 2012-11-19 – 2012-11-22 (×4): 150 mg via ORAL
  Filled 2012-11-19 (×6): qty 1.5
  Filled 2012-11-19: qty 2

## 2012-11-19 MED ORDER — DICYCLOMINE HCL 20 MG PO TABS: 20.0000 mg | ORAL_TABLET | Freq: Four times a day (QID) | ORAL | Status: AC | PRN

## 2012-11-19 MED ORDER — DIVALPROEX SODIUM 500 MG PO DR TAB
500.0000 mg | DELAYED_RELEASE_TABLET | Freq: Every morning | ORAL | Status: DC
  Administered 2012-11-20 – 2012-11-21 (×2): 500 mg via ORAL
  Filled 2012-11-19 (×3): qty 1

## 2012-11-19 MED ORDER — LOPERAMIDE HCL 2 MG PO CAPS: 2.0000 mg | ORAL_CAPSULE | ORAL | Status: AC | PRN

## 2012-11-19 MED ORDER — VITAMIN B-12 1000 MCG PO TABS
1000.0000 ug | ORAL_TABLET | Freq: Every day | ORAL | Status: DC
  Administered 2012-11-20 – 2012-11-26 (×7): 1000 ug via ORAL
  Filled 2012-11-19 (×9): qty 1

## 2012-11-19 MED ORDER — NAPROXEN 500 MG PO TABS
500.0000 mg | ORAL_TABLET | Freq: Two times a day (BID) | ORAL | Status: AC | PRN
  Administered 2012-11-19 – 2012-11-22 (×3): 500 mg via ORAL
  Filled 2012-11-19 (×3): qty 1

## 2012-11-19 MED ORDER — QUETIAPINE FUMARATE 100 MG PO TABS
150.0000 mg | ORAL_TABLET | Freq: Every day | ORAL | Status: DC
  Administered 2012-11-19: 300 mg via ORAL
  Filled 2012-11-19 (×2): qty 1
  Filled 2012-11-19: qty 3

## 2012-11-19 MED ORDER — ADULT MULTIVITAMIN W/MINERALS CH
1.0000 | ORAL_TABLET | Freq: Every morning | ORAL | Status: DC
  Administered 2012-11-20 – 2012-11-23 (×4): 1 via ORAL
  Filled 2012-11-19 (×6): qty 1

## 2012-11-19 MED ORDER — METHOCARBAMOL 500 MG PO TABS
500.0000 mg | ORAL_TABLET | Freq: Three times a day (TID) | ORAL | Status: AC | PRN
  Administered 2012-11-19 – 2012-11-23 (×3): 500 mg via ORAL
  Filled 2012-11-19 (×3): qty 1

## 2012-11-19 MED ORDER — DIVALPROEX SODIUM 250 MG PO DR TAB
500.0000 mg | DELAYED_RELEASE_TABLET | Freq: Every morning | ORAL | Status: DC
  Administered 2012-11-19: 500 mg via ORAL
  Filled 2012-11-19: qty 2

## 2012-11-19 MED ORDER — LITHIUM CARBONATE 150 MG PO CAPS
150.0000 mg | ORAL_CAPSULE | Freq: Three times a day (TID) | ORAL | Status: DC
  Administered 2012-11-19: 150 mg via ORAL
  Filled 2012-11-19 (×11): qty 1

## 2012-11-19 MED ORDER — ACETAMINOPHEN 325 MG PO TABS
650.0000 mg | ORAL_TABLET | Freq: Four times a day (QID) | ORAL | Status: DC | PRN
  Administered 2012-11-23 – 2012-11-25 (×3): 650 mg via ORAL

## 2012-11-19 MED ORDER — VITAMIN B-12 1000 MCG PO TABS
1000.0000 ug | ORAL_TABLET | Freq: Every day | ORAL | Status: DC
  Administered 2012-11-19: 1000 ug via ORAL
  Filled 2012-11-19 (×4): qty 1

## 2012-11-19 MED ORDER — QUETIAPINE FUMARATE 100 MG PO TABS
ORAL_TABLET | ORAL | Status: AC
  Filled 2012-11-19: qty 3

## 2012-11-19 MED ORDER — FERROUS SULFATE 325 (65 FE) MG PO TABS
325.0000 mg | ORAL_TABLET | Freq: Every day | ORAL | Status: DC
  Administered 2012-11-20 – 2012-11-26 (×7): 325 mg via ORAL
  Filled 2012-11-19 (×9): qty 1

## 2012-11-19 MED ORDER — ADULT MULTIVITAMIN W/MINERALS CH
1.0000 | ORAL_TABLET | Freq: Every morning | ORAL | Status: DC
  Administered 2012-11-19: 1 via ORAL
  Filled 2012-11-19: qty 1

## 2012-11-19 MED ORDER — HYDROXYZINE HCL 25 MG PO TABS
25.0000 mg | ORAL_TABLET | Freq: Four times a day (QID) | ORAL | Status: DC | PRN
  Administered 2012-11-19 – 2012-11-23 (×5): 25 mg via ORAL

## 2012-11-19 MED ORDER — MAGNESIUM HYDROXIDE 400 MG/5ML PO SUSP: 30.0000 mL | Freq: Every day | ORAL | Status: DC | PRN

## 2012-11-19 MED ORDER — HYDROXYZINE HCL 50 MG PO TABS: 50.0000 mg | ORAL_TABLET | Freq: Every evening | ORAL | Status: DC | PRN

## 2012-11-19 MED ORDER — LORAZEPAM 1 MG PO TABS
1.0000 mg | ORAL_TABLET | Freq: Three times a day (TID) | ORAL | Status: DC | PRN
  Administered 2012-11-20 – 2012-11-21 (×3): 1 mg via ORAL
  Filled 2012-11-19 (×3): qty 1

## 2012-11-19 MED ORDER — LITHIUM CARBONATE 150 MG PO CAPS
150.0000 mg | ORAL_CAPSULE | Freq: Three times a day (TID) | ORAL | Status: DC
  Administered 2012-11-20 – 2012-11-21 (×4): 150 mg via ORAL
  Filled 2012-11-19 (×8): qty 1

## 2012-11-19 MED ORDER — FERROUS SULFATE 325 (65 FE) MG PO TABS
325.0000 mg | ORAL_TABLET | Freq: Every day | ORAL | Status: DC
  Filled 2012-11-19 (×4): qty 1

## 2012-11-19 MED ORDER — ONDANSETRON 4 MG PO TBDP
4.0000 mg | ORAL_TABLET | Freq: Four times a day (QID) | ORAL | Status: AC | PRN
  Administered 2012-11-21: 4 mg via ORAL

## 2012-11-19 NOTE — ED Notes (Addendum)
Pt yelling stating "I am about to go postal, I need another cup of coffee."  "I want to see your superior now."  Charge nurse called to bedside to speak to patient.  Pt continues with behaviors, yelling at staff and cursing.  Due to current behaviors, decaf coffee offered to patient who refuses, states "I don't want to sleep, when I sleep I get depressed and I don't want to get depressed."  Security called to bedside.  Behavior continues after verbal attempts to calm her, see MAR for interventions.  States that staff is being mean to her.  The patient appears to be suspicious of security.  Yelling that she wants her belongings now.

## 2012-11-19 NOTE — ED Provider Notes (Signed)
Patient accepted by Dr. Dub Mikes to behavioral health.  BP 104/83  Pulse 80  Temp(Src) 98.4 F (36.9 C) (Oral)  Resp 22  Ht 5\' 10"  (1.778 m)  Wt 170 lb (77.111 kg)  BMI 24.39 kg/m2  SpO2 96%   Glynn Octave, MD 11/19/12 1406

## 2012-11-19 NOTE — BH Assessment (Signed)
[facility] Assessment Progress Note  Per Berneice Heinrich, RN, Administrative Coordinator, pt has been accepted to [facility] by Dahlia Byes, NP to the service of Geoffery Lyons, MD, Rm [id number].  This is contingent upon problems with IVC paperwork being resolved.  At 12:45 I called [facility] to notify them, first speaking to the pt's nurse, then the unit secretary.  She agreed to rectify the paperwork problems and fax the revised forms to [facility] for review before sending the pt.  Doylene Canning, MA Assessment Counselor 11/19/2012 @ 13:06

## 2012-11-19 NOTE — ED Notes (Signed)
Equal rise and fall of chest noted, sleeping soundly.

## 2012-11-19 NOTE — ED Notes (Signed)
The patient now is sleeping soundly, sitter at bedside.
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
## 2012-11-19 NOTE — ED Notes (Signed)
Unable to give po medications at this point, due to patient behaviors.

## 2012-11-19 NOTE — ED Provider Notes (Signed)
1610 Assumed care/disposition of patient. Patient is a woman who presents in a manic state. She is talking rapidly, gesturing rapidly, has had bazaar behavior in the ER. She was evaluated by telepsych who felt she needed to be in a psychiatric facility. He recommended medications which have been instituted. She has had IVC paperwork done and on the chart. Holding orders are on the chart. Police are currently in the department. Sitter is also at the bedside. She will be seen by [facility] for placement.   Nicoletta Dress. Colon Branch, MD 11/19/12 [phone]

## 2012-11-19 NOTE — ED Notes (Signed)
Patient semi woke, yelling out for someone to help her. Let her know that she has a breakfast tray coming. Sitter at bedside.

## 2012-11-19 NOTE — BH Assessment (Signed)
Assessment Note   [name] is a 52 y.o. single white female.  She is referred from [facility] ED under IVC initiated by the EDP.  Per the emergency Certificate:  "Psychotic and manic.  Disorganized thought process and potential for self harm."  Pt was also seen in consult by Tele-psychiatry.  The consult note reports the following:  "52 y/o F with a history of bipolar disorder who presented to the ER complaining of arthritic pain.  While she was being evaluated it was noticed that she had an AMS and was being tangential.  Patient continues to have a disorganized thought process and need constant redirection.  Her affect is labile and at this time she is considered to be a danger to self and others.  She is in need of inpatient care and stabilization."  Tele-psychiatry note reports no current or past SI or HI.  Pt is cooperative in the ED.  While documentation denies hallucinations or delusions, it does report that pt is psychotic.  Notes in EPIC indicate a history of crack cocaine and cannabis use, but it is unknown if pt currently uses.   Axis I: Bipolar Disorder NOS 296.80 Axis II: Deferred 799.8 Axis III:  Past Medical History  Diagnosis Date  . Bipolar 1 disorder   . Emphysema   . Polysubstance abuse   . Bronchitis   . Emphysema   . Asthma   . Hypertension   . Schizophrenia    Axis IV: Poor Compliance Axis V: GAF = 35  Past Medical History:  Past Medical History  Diagnosis Date  . Bipolar 1 disorder   . Emphysema   . Polysubstance abuse   . Bronchitis   . Emphysema   . Asthma   . Hypertension   . Schizophrenia     Past Surgical History  Procedure Laterality Date  . Abdominal hysterectomy    . Knee surgery      laser to left  . Appendectomy      Family History:  Family History  Problem Relation Age of Onset  . Rheum arthritis Mother   . Cancer Mother   . Diabetes Mother   . Hypertension Mother   . Rheum arthritis Father   . Cancer Father    . Diabetes Father   . Hypertension Father     Social History:  reports that she has been smoking.  She has never used smokeless tobacco. She reports that she uses illicit drugs (Marijuana, "Crack" cocaine, and Cocaine). She reports that she does not drink alcohol.  Additional Social History:  Alcohol / Drug Use Pain Medications: None reported Prescriptions: None reported Over the Counter: None reported Substance #1 Name of Substance 1: Cocaine/crack (per Telepsych note) 1 - Age of First Use: Unknown 1 - Amount (size/oz): Unknown 1 - Frequency: Unknown 1 - Duration: Unknown 1 - Last Use / Amount: Unknown  CIWA:   COWS:    Allergies:  Allergies  Allergen Reactions  . Cortisone Nausea And Vomiting    Also caused a fever    Home Medications:  (Not in a hospital admission)  OB/GYN Status:  No LMP recorded. Patient has had a hysterectomy.  General Assessment Data Location of Assessment: [facility] Assessment Services Living Arrangements: Other (Comment) (Telepsych note only says "Other") Can pt return to current living arrangement?: Yes Admission Status: Involuntary Is patient capable of signing voluntary admission?: No Transfer from: Acute Hospital Referral Source: Other [facility] ED)  Education Status Is patient  currently in school?: No Highest grade of school patient has completed: High School  Risk to self Suicidal Ideation: No Suicidal Intent: No Is patient at risk for suicide?: No Suicidal Plan?: No Access to Means: No What has been your use of drugs/alcohol within the last 12 months?: Notes indicate history of cocaine/crack and cannabis use Previous Attempts/Gestures: No How many times?: 0 Other Self Harm Risks: Altered mental status, labile mood, impaired judgment & insight Triggers for Past Attempts: Other (Comment) (Not applicable) Intentional Self Injurious Behavior: None (None reported) Family Suicide History: Unknown Recent stressful life  event(s): Other (Comment) (Presents complaining of arthritis pain) Persecutory voices/beliefs?: No Depression: No Substance abuse history and/or treatment for substance abuse?: Yes (Notes indicate history of cocaine/crack and cannabis use)  Risk to Others Homicidal Ideation: No Thoughts of Harm to Others: Yes-Currently Present Comment - Thoughts of Harm to Others: Telepsych note states that pt is a danger to self and others. Current Homicidal Intent: No Current Homicidal Plan: No Access to Homicidal Means: No Identified Victim: None specified History of harm to others?: No (None reported) Assessment of Violence: None Noted Violent Behavior Description: Cooperative in the ED. Does patient have access to weapons?: No (None reported) Criminal Charges Pending?: No (None reported) Does patient have a court date: No (None reported)  Psychosis Hallucinations: None noted (IVC paperwork reports pt is psychotic NOS) Delusions: None noted (IVC paperwork reports pt is psychotic NOS)  Mental Status Report Appear/Hygiene: Other (Comment) (Unremarkable) Eye Contact:  (Unknown) Motor Activity: Other (Comment) ("Unchanged") Speech: Other (Comment) (Normal rate and tone) Level of Consciousness: Alert Mood: Other (Comment) ("OK") Affect: Labile Anxiety Level:  (Unspecified) Thought Processes: Flight of Ideas (Disorganized) Judgement: Impaired Orientation: Time;Place;Person Obsessive Compulsive Thoughts/Behaviors: None (None reported)  Cognitive Functioning Concentration:  ("Unchanged") Memory:  (Unspecified) IQ: Average Insight: Poor Impulse Control: Poor Appetite:  ("Unchanged") Weight Loss:  (Unknown) Weight Gain:  (Unknown) Sleep: No Change Total Hours of Sleep:  (Unspecified) Vegetative Symptoms:  (None reported)     Abuse/Neglect [facility]) Physical Abuse: Denies Verbal Abuse: Denies Sexual Abuse: Denies  Prior Inpatient Therapy Prior Inpatient Therapy: Yes Prior Therapy  Dates: Unknown Prior Therapy Facilty/Provider(s): Unknown Reason for Treatment: Unknown  Prior Outpatient Therapy Prior Outpatient Therapy: Yes Prior Therapy Dates: Current Prior Therapy Facilty/Provider(s): Unspecified Reason for Treatment: Unspecified          Abuse/Neglect Assessment (Assessment to be complete while patient is alone) Physical Abuse: Denies Verbal Abuse: Denies Sexual Abuse: Denies Exploitation of patient/patient's resources:  (Unknown) Self-Neglect:  (Unknown)          Additional Information 1:1 In Past 12 Months?: No CIRT Risk: No Elopement Risk: No Does patient have medical clearance?: Yes     Disposition:  Disposition Initial Assessment Completed for this Encounter: Yes Disposition of Patient: Inpatient treatment program Type of inpatient treatment program: Adult Pt reviewed with Dahlia Byes, NP, who agrees to accept pt to [facility] to the service of Geoffery Lyons, MD, Rm 306-1.  Pt to be transported to [facility] by [facility] under IVC.  On Site Evaluation by:   Reviewed with Physician:  Dahlia Byes, NP  Doylene Canning, MA Assessment Counselor Raphael Gibney 11/19/2012 5:14 PM

## 2012-11-19 NOTE — ED Notes (Signed)
Behavioral Health notified of possible bed assignment for said pt, Paperwork to be completed before pt able to be excepted.

## 2012-11-19 NOTE — Tx Team (Signed)
Initial Interdisciplinary Treatment Plan  PATIENT STRENGTHS: (choose at least two) Active sense of humor General fund of knowledge  PATIENT STRESSORS: Health problems Legal issue Substance abuse   PROBLEM LIST: Problem List/Patient Goals Date to be addressed Date deferred Reason deferred Estimated date of resolution  Mania      Depression      Substance Abuse                                           DISCHARGE CRITERIA:  Improved stabilization in mood, thinking, and/or behavior Verbal commitment to aftercare and medication compliance  PRELIMINARY DISCHARGE PLAN: Attend aftercare/continuing care group Outpatient therapy  PATIENT/FAMIILY INVOLVEMENT: This treatment plan has been presented to and reviewed with the patient, NEVEYAH GARZON.  The patient and family have been given the opportunity to ask questions and make suggestions.  Jacques Navy A 11/19/2012, 9:41 PM

## 2012-11-19 NOTE — ED Notes (Signed)
Pt acting out stating she was going to leave. Pt was caught smoking in the bathroom after refusing to give her pocket book to security. [employer] had to be called due to pt threatening to leave. Pt now made IVC. [employer] and sitter at bedside.

## 2012-11-20 ENCOUNTER — Encounter (HOSPITAL_COMMUNITY): Payer: Self-pay | Admitting: Psychiatry

## 2012-11-20 DIAGNOSIS — F3110 Bipolar disorder, current episode manic without psychotic features, unspecified: Secondary | ICD-10-CM | POA: Diagnosis present

## 2012-11-20 DIAGNOSIS — F411 Generalized anxiety disorder: Secondary | ICD-10-CM

## 2012-11-20 MED ORDER — DOCUSATE SODIUM 100 MG PO CAPS
100.0000 mg | ORAL_CAPSULE | Freq: Two times a day (BID) | ORAL | Status: DC | PRN
  Administered 2012-11-20 – 2012-11-21 (×2): 100 mg via ORAL
  Filled 2012-11-20 (×2): qty 1

## 2012-11-20 MED ORDER — BENZOCAINE 10 % MT GEL
Freq: Four times a day (QID) | OROMUCOSAL | Status: DC | PRN
  Administered 2012-11-20 – 2012-11-23 (×4): via OROMUCOSAL
  Filled 2012-11-20 (×2): qty 9.4

## 2012-11-20 NOTE — Progress Notes (Signed)
Adult Psychoeducational Group Note  Date:  11/20/2012 Time:  0840  Group Topic/Focus:  Goals Group:   The focus of this group is to help patients establish daily goals to achieve during treatment and discuss how the patient can incorporate goal setting into their daily lives to aide in recovery.  Participation Level:  Minimal  Participation Quality:  Intrusive, Inattentive and Monopolizing  Affect:  Angry, Defensive and Flat  Cognitive:  Confused and Lacking  Insight: Limited  Engagement in Group:  Lacking, Limited, Monopolizing and Off Topic  Modes of Intervention:  Confrontation  Additional Comments:  After a few minutes patient left the room.  Roselee Culver 11/20/2012, 12:13 PM
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
## 2012-11-20 NOTE — Progress Notes (Signed)
Patient ID: [name], female   DOB: 1960-10-09, 52 y.o.   MRN: [id number]  D: Patient has been extremely manic on the unit today, she has been very intrusive and has had very poor boundaries. Pt was on the 300 hall, and was requiring frequent redirections regarding her intrusive behaviors. Pt was seen by the doctor, and at that time orders were written for patient to be moved to the 400 hall as a Do Not Admit. Pt became agitated once on the 400 hall, she was given Ativan 1mg . Pt reported being negative SI/HI, no AH/VH. A: 15 min checks continued for patient safety. R: Pts safety maintained.
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
## 2012-11-20 NOTE — Progress Notes (Signed)
The focus of this group is to help patients review their daily goal of treatment and discuss progress on daily workbooks. Pt attended the evening group session and responded to discussion prompts from the Writer. Pt reported having a good day, the highlight of which were "all of the handsome men" she was able to be near today. Pt did not appear to take wrap-up group seriously and had no comments on her stay here or her treatment. Pt required redirection several times to keep from speaking over her peers when it was their turn to speak. Pt's only additional requests from Nursing Staff this evening were towels/washcloths and an extra pillow, all of which were provided to her following group by the Writer.
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
## 2012-11-21 MED ORDER — DIVALPROEX SODIUM 500 MG PO DR TAB
500.0000 mg | DELAYED_RELEASE_TABLET | Freq: Two times a day (BID) | ORAL | Status: DC
  Administered 2012-11-21 – 2012-11-26 (×10): 500 mg via ORAL
  Filled 2012-11-21 (×11): qty 1
  Filled 2012-11-21 (×2): qty 6
  Filled 2012-11-21: qty 1

## 2012-11-21 MED ORDER — MAGNESIUM CITRATE PO SOLN
1.0000 | Freq: Once | ORAL | Status: AC
  Administered 2012-11-21: 1 via ORAL

## 2012-11-21 MED ORDER — LITHIUM CARBONATE 300 MG PO CAPS
300.0000 mg | ORAL_CAPSULE | Freq: Two times a day (BID) | ORAL | Status: DC
  Administered 2012-11-21 – 2012-11-22 (×2): 300 mg via ORAL
  Filled 2012-11-21 (×4): qty 1

## 2012-11-21 MED ORDER — MOMETASONE FURO-FORMOTEROL FUM 100-5 MCG/ACT IN AERO
2.0000 | INHALATION_SPRAY | Freq: Two times a day (BID) | RESPIRATORY_TRACT | Status: DC
  Administered 2012-11-21 – 2012-11-26 (×10): 2 via RESPIRATORY_TRACT
  Filled 2012-11-21: qty 8.8

## 2012-11-21 NOTE — Progress Notes (Signed)
Patient ID: [name], female   DOB: 25-Mar-1961, 52 y.o.   MRN: [id number] D. The patient was intrusive with loud, pressured, hyper-verbal speech. She was very demanding and became easily agitated. Flight of ideas noted.  A. Met with patient frequently. Policies of unit explained and reinforced several times. Encouraged to attend evening group. Administered HS medication. R. The patient attended evening group. Needed to be redirected frequently for disruptive behavior. Compliant with medication.

## 2012-11-21 NOTE — [facility] Group Notes (Signed)
[facility] Group Notes:  (Clinical Social Work)  11/21/2012   11:15am-12:00pm  Summary of Progress/Problems:  The main focus of today's process group was to listen to a variety of genres of music and to identify that different types of music provoke different responses.  The patient then was able to identify personally what was soothing for them, as well as energizing.  Handouts were used to record feelings evoked, as well as how patient can personally use this knowledge in sleep habits, with depression, and with other symptoms.  The patient expressed understanding of concepts, as well as knowledge of how each type of music affected them and how this can be used when they are at home as a tool in their recovery.  She kept dozing off, then asked CSW to play music again because she had missed it.  Type of Therapy:  Music Therapy   Participation Level:  Active  Participation Quality:  Attentive and Sharing, but Drowsy  Affect:  Blunted  Cognitive:  Oriented  Insight:  Engaged  Engagement in Therapy:  Engaged  Modes of Intervention:   Activity, Exploration  Ambrose Mantle, LCSW 11/21/2012, 12:38 PM

## 2012-11-21 NOTE — Progress Notes (Addendum)
Patient ID: [name], female   DOB: 08-19-1960, 52 y.o.   MRN: [id number] D.Patient presents with manic mood- tangential thoughts and pressured speech. She continues to be very demanding and intrusive with staff, requiring frequent redirection for her multiple requests. She states '' I didn't sleep , no my nerves were bothering me and then this tooth here, and I can't fill this paper out you need to find me some glasses, some reading glasses, and I need something for constipation.'' A. Allowed pt to ventilate and support given.Medications given - also prn ativan given for anxiety. R. Patient currently cooperative no physical aggression or further voiced concerns at this time. Discussed with provider Molli Knock Np of patients presentation of (manic mood) and above requests. Denies SI/HI/A/V Hallucinations. Will continue to monitor q 15 minutes for safety.

## 2012-11-21 NOTE — Progress Notes (Signed)
Adult Psychoeducational Group Note  Date:  11/21/2012 Time:  10:29 PM  Group Topic/Focus:  Wrap-Up Group:   The focus of this group is to help patients review their daily goal of treatment and discuss progress on daily workbooks.  Participation Level:  Active  Participation Quality:  Appropriate  Affect:  Appropriate  Cognitive:  Appropriate  Insight: Lacking  Engagement in Group:  Supportive  Modes of Intervention:  Support  Additional Comments:  Patient attended and participated in group tonight. She reports having a wonderful day. She went outside eat her meals, pray and attended groups. Her family and friends are her support system.  Lita Mains [facility] 11/21/2012, 10:29 PM

## 2012-11-21 NOTE — Progress Notes (Signed)
Patient ID: [name], female   DOB: [birth year], 52 y.o.   MRN: [id number] D. The patient remains hypomanic. Speech is pressured, rapid and loud. Thoughts are loose with flight of idea. Insisting she needs to be discharged tomorrow to attend a doctors appointment. A. Met with the patient 1:1. Encouraged to attend evening wrap up group. Administered HS medications. R. The patient attended wrap up group. Stated that she had a good day because she was able to go outside and play basketball. Identifies her family and friends ans her support system.
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
## 2012-11-21 NOTE — Progress Notes (Signed)
[facility] MD Progress Note  11/21/2012 10:59 AM [name]  MRN:  [id number]  Subjective:  Patient has been struggling with manic symptoms like pressured speech continues with manic behaviors, with poor or no insight.  Ms. Jefcoat wants to be discharged today or tomorrow to be at her orthopedic appointment at 1:30 pm regarding hip surgery.  Less irritable today and easier to direct, medication changes made (see below). Denies depression and anxiety, sleep was "on and off" due to tooth pain, appetite good.  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 Bipolar 1 disorder   . Emphysema   . Polysubstance abuse   . Bronchitis   . Emphysema   . Asthma   . Hypertension   . Schizophrenia    Axis IV: other psychosocial or environmental problems, problems related to social environment and problems with primary support group Axis V: 41-50 serious symptoms  ADL's:  Intact  Sleep: Fair  Appetite:  Good  Suicidal Ideation:  Denies Homicidal Ideation:  Denies  Psychiatric Specialty Exam: Review of Systems  Constitutional: Negative.   HENT: Negative.   Eyes: Negative.   Respiratory: Negative.   Cardiovascular: Negative.   Gastrointestinal: Negative.   Genitourinary: Negative.   Musculoskeletal: Negative.   Skin: Negative.   Neurological: Negative.   Endo/Heme/Allergies: Negative.   Psychiatric/Behavioral:       Manic symptoms    Blood pressure 102/71, pulse 88, temperature 97 F (36.1 C), temperature source Oral, resp. rate 19, height 5\' 9"  (1.753 m), weight 82.101 kg (181 lb), SpO2 96.00%.Body mass index is 26.72 kg/(m^2).  General Appearance: Casual  Eye Contact::  Fair  Speech:  Pressured  Volume:  Increased  Mood:  Euphoric  Affect:  Congruent  Thought Process:  Disorganized  Orientation:  Full (Time, Place, and Person)  Thought Content:  WDL  Suicidal Thoughts:  No  Homicidal  Thoughts:  No  Memory:  Immediate;   Fair Recent;   Fair Remote;   Fair  Judgement:  Impaired  Insight:  Lacking  Psychomotor Activity:  Decreased  Concentration:  Fair  Recall:  Fair  Akathisia:  No  Handed:  Right  AIMS (if indicated):     Assets:  Communication Skills Resilience  Sleep:  Number of Hours: 3.5   Current Medications: Current Facility-Administered Medications  Medication Dose Route Frequency Provider Last Rate Last Dose  . acetaminophen (TYLENOL) tablet 650 mg  650 mg Oral Q6H PRN Earney Navy, NP      . alum & mag hydroxide-simeth (MAALOX/MYLANTA) 200-200-20 MG/5ML suspension 30 mL  30 mL Oral Q4H PRN Earney Navy, NP      . benzocaine (ORAJEL) 10 % mucosal gel   Mouth/Throat QID PRN Nehemiah Settle, MD      . dicyclomine (BENTYL) tablet 20 mg  20 mg Oral Q6H PRN Earney Navy, NP      . divalproex (DEPAKOTE) DR tablet 500 mg  500 mg Oral Q12H Nanine Means, NP      . docusate sodium (COLACE) capsule 100 mg  100 mg Oral BID PRN Nehemiah Settle, MD   100 mg at 11/21/12 0002  . ferrous sulfate tablet 325 mg  325 mg Oral Q breakfast Earney Navy, NP   325 mg at 11/21/12 0815  . hydrOXYzine (ATARAX/VISTARIL) tablet 25 mg  25 mg Oral Q6H PRN Earney Navy, NP  25 mg at 11/21/12 0650  . hydrOXYzine (ATARAX/VISTARIL) tablet 50 mg  50 mg Oral QHS PRN Earney Navy, NP      . lithium carbonate capsule 150 mg  150 mg Oral TID WC Earney Navy, NP   150 mg at 11/21/12 0651  . loperamide (IMODIUM) capsule 2-4 mg  2-4 mg Oral PRN Earney Navy, NP      . LORazepam (ATIVAN) tablet 1 mg  1 mg Oral TID PRN Earney Navy, NP   1 mg at 11/21/12 0815  . magnesium hydroxide (MILK OF MAGNESIA) suspension 30 mL  30 mL Oral Daily PRN Earney Navy, NP      . methocarbamol (ROBAXIN) tablet 500 mg  500 mg Oral Q8H PRN Earney Navy, NP   500 mg at 11/19/12 2211  . multivitamin with minerals tablet 1 tablet  1  tablet Oral q morning - 10a Earney Navy, NP   1 tablet at 11/21/12 0815  . naproxen (NAPROSYN) tablet 500 mg  500 mg Oral BID PRN Earney Navy, NP   500 mg at 11/19/12 2211  . ondansetron (ZOFRAN-ODT) disintegrating tablet 4 mg  4 mg Oral Q6H PRN Earney Navy, NP   4 mg at 11/21/12 0316  . QUEtiapine (SEROQUEL) tablet 150 mg  150 mg Oral QHS Earney Navy, NP   150 mg at 11/20/12 2049  . vitamin B-12 (CYANOCOBALAMIN) tablet 1,000 mcg  1,000 mcg Oral Daily Earney Navy, NP   1,000 mcg at 11/21/12 0815    Lab Results: No results found for this or any previous visit (from the past 48 hour(s)).  Physical Findings: AIMS: Facial and Oral Movements Muscles of Facial Expression: None, normal Lips and Perioral Area: None, normal Jaw: None, normal Tongue: None, normal,Extremity Movements Upper (arms, wrists, hands, fingers): None, normal Lower (legs, knees, ankles, toes): None, normal, Trunk Movements Neck, shoulders, hips: None, normal, Overall Severity Severity of abnormal movements (highest score from questions above): None, normal Incapacitation due to abnormal movements: None, normal Patient's awareness of abnormal movements (rate only patient's report): No Awareness, Dental Status Current problems with teeth and/or dentures?: Yes (hole upper L tooth) Does patient usually wear dentures?: No  CIWA:  CIWA-Ar Total: 4 COWS:  COWS Total Score: 4  Treatment Plan Summary: Daily contact with patient to assess and evaluate symptoms and progress in treatment Medication management  Plan:  Review of chart, vital signs, medications, and notes. 1-Individual and group therapy 2-Medication management for mood instability and anxiety:  Medications reviewed with the patient and Depakote increased to BID and Lithium increased from 150 mg TID to 300 mg BID. 3-Coping skills for mood instability, anxiety, and mania 4-Continue crisis stabilization and management 5-Address health  issues--monitoring vital signs, stable 6-Treatment plan in progress to prevent relapse of mood instability and anxiety  Medical Decision Making Problem Points:  Review of psycho-social stressors (1) Data Points:  Review of new medications or change in dosage (2)  I certify that inpatient services furnished can reasonably be expected to improve the patient's condition.   Nanine Means, PMH-NP 11/21/2012, 10:59 AM  Reviewed the information documented and agree with the treatment plan.  Diannia Hogenson,JANARDHAHA R. 11/22/2012 12:28 PM

## 2012-11-21 NOTE — [facility] Group Notes (Signed)
[facility] Group Notes:  (Nursing/MHT/Case Management/Adjunct)  Date:  11/21/2012  Time:  11:03 AM  Date:  11/21/2012  Time:  10:41 AM  Type of Therapy:  Psychoeducational Skills  Participation Level:  Did Not Attend  Participation Quality:  na  Affect:  na  Cognitive:  na  Insight:  None  Engagement in Group:  na  Modes of Intervention:  na  Summary of Progress/Problems: psychoeducational review - healthy support systems and review of SELF INVENTORY - pt did not attend   Malva Limes 11/21/2012, 11:03 AM

## 2012-11-22 DIAGNOSIS — F1210 Cannabis abuse, uncomplicated: Secondary | ICD-10-CM

## 2012-11-22 DIAGNOSIS — F3110 Bipolar disorder, current episode manic without psychotic features, unspecified: Principal | ICD-10-CM

## 2012-11-22 MED ORDER — OLANZAPINE 5 MG PO TBDP
15.0000 mg | ORAL_TABLET | Freq: Three times a day (TID) | ORAL | Status: DC | PRN
  Administered 2012-11-22 – 2012-11-26 (×5): 15 mg via ORAL
  Filled 2012-11-22: qty 1
  Filled 2012-11-22 (×4): qty 3

## 2012-11-22 MED ORDER — LITHIUM CARBONATE 300 MG PO CAPS
600.0000 mg | ORAL_CAPSULE | Freq: Every day | ORAL | Status: DC
  Administered 2012-11-23 – 2012-11-25 (×3): 600 mg via ORAL
  Filled 2012-11-22 (×3): qty 2
  Filled 2012-11-22: qty 6
  Filled 2012-11-22: qty 2

## 2012-11-22 NOTE — Progress Notes (Signed)
Recreation Therapy Notes  Date: 08.04.2014 Time: 9:30am Location: [address] Day Room  Group Topic: Leisure Education  Goal Area(s) Addresses:  Patient will verbalize activity of interest by end of group session. Patient will verbalize the ability to use positive leisure/recreation as a coping mechanism.  Behavioral Response: Did not attend  Jearl Klinefelter, LRT/CTRS  Jearl Klinefelter 11/22/2012 12:58 PM

## 2012-11-22 NOTE — Progress Notes (Signed)
Pt did not fill out a self inventory sheet this am, pt has been labile and irritable today, yelling about wanting to be discharged so that she can go to her orthopedic appointment to set up her surgery date for her right hip.  Pt became very angry with MD this am when she was told that she would not be able to be discharged today.  Verbally deescalated pt and gave prn anxiety medication per orders, pt has calmed down and has been more cooperative this afternoon, had to be encouraged to come out of room to med window for medications.

## 2012-11-22 NOTE — Progress Notes (Signed)
Adult Psychoeducational Group Note  Date:  11/22/2012 Time:  11:00am Group Topic/Focus:  Goals Group:   The focus of this group is to help patients establish daily goals to achieve during treatment and discuss how the patient can incorporate goal setting into their daily lives to aide in recovery.  Participation Level:  Active  Participation Quality:  Appropriate and Attentive  Affect:  Appropriate  Cognitive:  Alert and Appropriate  Insight: Appropriate  Engagement in Group:  Engaged  Modes of Intervention:  Discussion and Education  Additional Comments:  Pt attended and participated in group. When ask what her goal was pt stated it was to keep her anxiety down by listening to to others lying down and being quite. She says she is her own struggle.  Shelly Bombard D 11/22/2012, 12:14 PM
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
## 2012-11-22 NOTE — Progress Notes (Signed)
Patient ID: [name], female   DOB: 1960-06-28, 52 y.o.   MRN: [id number] [facility] MD Progress Note  11/22/2012 11:30 AM SARAIYAH HEMMINGER  MRN:  [id number] Subjective: "I need to be discharged home today, I have an appointment with my orthopedic surgeon." Objective: Patient is extremely labile, manic and agitated this morning. She is is argumentative, aggressive, verbally abusive towards this provider. She was making threatening remarks and demands to be discharged or else something is going to happen. Patient has no insight into her admission and showing poor judgment.  Diagnosis:   Axis I: Bipolar 1 disorder recent episode Manic           Cannabis use disorder Axis II: Deferred Axis III:  Past Medical History  Diagnosis Date  . Emphysema   . Polysubstance abuse   . Bronchitis   . Emphysema   . Asthma   . Hypertension    Axis IV: other psychosocial or environmental problems, problems related to social environment and problems with primary support group Axis V: 41-50 serious symptoms  ADL's:  Intact  Sleep: Fair  Appetite:  Good  Suicidal Ideation:  Denies Homicidal Ideation:  Denies  Psychiatric Specialty Exam: Review of Systems  Constitutional: Negative.   HENT: Negative.   Eyes: Negative.   Respiratory: Negative.   Cardiovascular: Negative.   Gastrointestinal: Negative.   Genitourinary: Negative.   Musculoskeletal: Negative.   Skin: Negative.   Neurological: Negative.   Endo/Heme/Allergies: Negative.   Psychiatric/Behavioral:       Manic symptoms    Blood pressure 114/81, pulse 81, temperature 98.2 F (36.8 C), temperature source Oral, resp. rate 20, height 5\' 9"  (1.753 m), weight 82.101 kg (181 lb), SpO2 96.00%.Body mass index is 26.72 kg/(m^2).  General Appearance: Casual  Eye Contact::  Fair  Speech:  Pressured  Volume:  Increased  Mood:  Euphoric  Affect:  Congruent  Thought Process:  Disorganized  Orientation:  Full (Time, Place, and Person)  Thought  Content:  WDL  Suicidal Thoughts:  No  Homicidal Thoughts:  No  Memory:  Immediate;   Fair Recent;   Fair Remote;   Fair  Judgement:  Impaired  Insight:  Lacking  Psychomotor Activity:  Decreased  Concentration:  Fair  Recall:  Fair  Akathisia:  No  Handed:  Right  AIMS (if indicated):     Assets:  Communication Skills Resilience  Sleep:  Number of Hours: 4   Current Medications: Current Facility-Administered Medications  Medication Dose Route Frequency Provider Last Rate Last Dose  . acetaminophen (TYLENOL) tablet 650 mg  650 mg Oral Q6H PRN Earney Navy, NP      . alum & mag hydroxide-simeth (MAALOX/MYLANTA) 200-200-20 MG/5ML suspension 30 mL  30 mL Oral Q4H PRN Earney Navy, NP      . benzocaine (ORAJEL) 10 % mucosal gel   Mouth/Throat QID PRN Nehemiah Settle, MD      . dicyclomine (BENTYL) tablet 20 mg  20 mg Oral Q6H PRN Earney Navy, NP      . divalproex (DEPAKOTE) DR tablet 500 mg  500 mg Oral Q12H Nanine Means, NP   500 mg at 11/22/12 0824  . docusate sodium (COLACE) capsule 100 mg  100 mg Oral BID PRN Nehemiah Settle, MD   100 mg at 11/21/12 0002  . ferrous sulfate tablet 325 mg  325 mg Oral Q breakfast Earney Navy, NP   325 mg at 11/22/12 0824  . hydrOXYzine (ATARAX/VISTARIL) tablet  25 mg  25 mg Oral Q6H PRN Earney Navy, NP   25 mg at 11/22/12 1003  . [START ON 11/23/2012] lithium carbonate capsule 600 mg  600 mg Oral QHS Quadry Kampa      . loperamide (IMODIUM) capsule 2-4 mg  2-4 mg Oral PRN Earney Navy, NP      . magnesium hydroxide (MILK OF MAGNESIA) suspension 30 mL  30 mL Oral Daily PRN Earney Navy, NP      . methocarbamol (ROBAXIN) tablet 500 mg  500 mg Oral Q8H PRN Earney Navy, NP   500 mg at 11/22/12 0825  . mometasone-formoterol (DULERA) 100-5 MCG/ACT inhaler 2 puff  2 puff Inhalation BID Nanine Means, NP   2 puff at 11/22/12 0826  . multivitamin with minerals tablet 1 tablet  1 tablet Oral  q morning - 10a Earney Navy, NP   1 tablet at 11/22/12 0824  . naproxen (NAPROSYN) tablet 500 mg  500 mg Oral BID PRN Earney Navy, NP   500 mg at 11/22/12 0334  . OLANZapine zydis (ZYPREXA) disintegrating tablet 15 mg  15 mg Oral Q8H PRN Maecyn Panning   15 mg at 11/22/12 1037  . ondansetron (ZOFRAN-ODT) disintegrating tablet 4 mg  4 mg Oral Q6H PRN Earney Navy, NP   4 mg at 11/21/12 0316  . QUEtiapine (SEROQUEL) tablet 150 mg  150 mg Oral QHS Earney Navy, NP   150 mg at 11/21/12 2202  . vitamin B-12 (CYANOCOBALAMIN) tablet 1,000 mcg  1,000 mcg Oral Daily Earney Navy, NP   1,000 mcg at 11/22/12 7829    Lab Results: No results found for this or any previous visit (from the past 48 hour(s)).  Physical Findings: AIMS: Facial and Oral Movements Muscles of Facial Expression: None, normal Lips and Perioral Area: None, normal Jaw: None, normal Tongue: None, normal,Extremity Movements Upper (arms, wrists, hands, fingers): None, normal Lower (legs, knees, ankles, toes): None, normal, Trunk Movements Neck, shoulders, hips: None, normal, Overall Severity Severity of abnormal movements (highest score from questions above): None, normal Incapacitation due to abnormal movements: None, normal Patient's awareness of abnormal movements (rate only patient's report): No Awareness, Dental Status Current problems with teeth and/or dentures?: Yes (hole upper L tooth) Does patient usually wear dentures?: No  CIWA:  CIWA-Ar Total: 0 COWS:  COWS Total Score: 4  Treatment Plan Summary: Daily contact with patient to assess and evaluate symptoms and progress in treatment Medication management  Plan:  Review of chart, vital signs, medications, and notes. 1-Individual and group therapy 2-Medication management for mood instability and anxiety:   3-Coping skills for mood instability, anxiety, and mania 4-Continue crisis stabilization and management 5-Address health  issues--monitoring vital signs, stable 6-Treatment plan in progress to prevent relapse of mood instability and anxiety 7- Continue Depakote ER 500mg  po BID for labile mood 8- Change Lithium to 600mg  po at bedtime. Medical Decision Making Problem Points:  Review of psycho-social stressors (1) Data Points:  Review of new medications or change in dosage (2)  I certify that inpatient services furnished can reasonably be expected to improve the patient's condition.   Chyanne Kohut,MD 11/22/2012, 11:30 AM

## 2012-11-22 NOTE — [facility] Group Notes (Signed)
[facility] LCSW Group Therapy  11/22/2012 1:15 pm  Type of Therapy: Process Group Therapy  Participation Level:  Did not attend   Summary of Progress/Problems: Today's group addressed the issue of overcoming obstacles.  Patients were asked to identify their biggest obstacle post d/c that stands in the way of their on-going success, and then problem solve as to how to manage this.  Ida Rogue 11/22/2012   5:31 PM

## 2012-11-22 NOTE — Tx Team (Signed)
  Interdisciplinary Treatment Plan Update   Date Reviewed:  11/22/2012  Time Reviewed:  8:36 AM  Progress in Treatment:   Attending groups: Yes Participating in groups: Yes Taking medication as prescribed: Yes  Tolerating medication: Yes Family/Significant other contact made: No Patient understands diagnosis: No Limited insight  Discussing patient identified problems/goals with staff: Yes Medical problems stabilized or resolved: Yes Denies suicidal/homicidal ideation: Yes  In tx team Patient has not harmed self or others: Yes  For review of initial/current patient goals, please see plan of care.  Estimated Length of Stay:  4-5 days  Reason for Continuation of Hospitalization: Mania Medication stabilization  New Problems/Goals identified:  N/A  Discharge Plan or Barriers:   return home, follow up outpt  Additional Comments:  "I need to be discharged home today, I have an appointment with my orthopedic surgeon."  Objective: Patient is extremely labile, manic and agitated this morning. She is is argumentative, aggressive, verbally abusive towards this provider. She was making threatening remarks and demands to be discharged or else something is going to happen. Patient has no insight into her admission and showing poor judgment.    Attendees:  Signature: Thedore Mins, MD 11/22/2012 8:36 AM   Signature: Richelle Ito, LCSW 11/22/2012 8:36 AM  Signature: Fransisca Kaufmann, NP 11/22/2012 8:36 AM  Signature: Joslyn Devon, RN 11/22/2012 8:36 AM  Signature:  11/22/2012 8:36 AM  Signature:  11/22/2012 8:36 AM  Signature:   11/22/2012 8:36 AM  Signature:    Signature:    Signature:    Signature:    Signature:    Signature:      Scribe for Treatment Team:   Richelle Ito, LCSW  11/22/2012 8:36 AM

## 2012-11-22 NOTE — Progress Notes (Signed)
D: Patient in bed for most part of the evening. She woke up after med pass. She appeared manic, demanding and irritable. She requested for cloths/ shoes to wear for an appointment tomorrow, requested for steroid injection and said she gets dilaudid ever yweek for her pain. A:  Writer encouraged and supported patient.  Writer checked cloths closet and got patient tops to try on. Gave patient her HS medications as ordered.  R: Patient stated she appreciated the cloths, and returned to her room. Q 15 minute check continues as ordered to maintain safety.

## 2012-11-23 MED ORDER — ADULT MULTIVITAMIN W/MINERALS CH
1.0000 | ORAL_TABLET | Freq: Every morning | ORAL | Status: DC
  Administered 2012-11-24 – 2012-11-26 (×3): 1 via ORAL
  Filled 2012-11-23 (×5): qty 1

## 2012-11-23 MED ORDER — QUETIAPINE FUMARATE 200 MG PO TABS
200.0000 mg | ORAL_TABLET | Freq: Every day | ORAL | Status: DC
  Administered 2012-11-23 – 2012-11-25 (×3): 200 mg via ORAL
  Filled 2012-11-23 (×5): qty 1

## 2012-11-23 NOTE — Progress Notes (Signed)
D:  Patient has been up and present in the milieu most of the shift.  She is loud and demanding at times, attempts to manipulate and pull other patients at times.  Became very upset when she was told there would be no outside time today.  She attempted to circulate a petition for all the patients to sign so that they could go out.   A:  Medications given as prescribed.  Explained to patient that there was not enough staff available to take patients outside and maintain safety of the milieu as well.    R:  Patient loud at times, but can be redirected.  She did finally verbalize understanding about the safety issues.  She is not suicidal at this time.  She is slowly improving.  Safety is maintained with 15 minute checks.

## 2012-11-23 NOTE — ED Provider Notes (Signed)
Medical screening examination/treatment/procedure(s) were performed by non-physician practitioner and as supervising physician I was immediately available for consultation/collaboration.   Frankee Gritz M Beula Joyner, DO 11/23/12 1224 

## 2012-11-23 NOTE — Progress Notes (Signed)
Patient ID: [name], female   DOB: May 24, 1960, 52 y.o.   MRN: [id number]  [facility] MD Progress Note  11/23/2012 2:18 PM JALESIA LOUDENSLAGER  MRN:  [id number] Subjective:  Patient states "I slept good. I feel great. I just came in for some family issues because I was too stressed. I should have just went to a hotel instead to get away. My anxiety is much lower. I just need to get my hip surgery done. I don't think I'm manic anymore."   Objective:  Patient pleasant during conversation with Clinical research associate. She was observed later knocking loudly on the nursing station demanding to see her RN about a toothache. Patient observed to be labile, irritable with increased loudness of speech. Nursing staff report that she was difficult to redirect and was demanding to be sent out right away to see a dentist.   Diagnosis:   Axis I: Bipolar 1 disorder recent episode Manic           Cannabis use disorder Axis II: Deferred Axis III:  Past Medical History  Diagnosis Date  . Emphysema   . Polysubstance abuse   . Bronchitis   . Emphysema   . Asthma   . Hypertension    Axis IV: other psychosocial or environmental problems, problems related to social environment and problems with primary support group Axis V: 41-50 serious symptoms  ADL's:  Intact  Sleep: Fair  Appetite:  Good  Suicidal Ideation:  Denies Homicidal Ideation:  Denies  Psychiatric Specialty Exam: Review of Systems  Constitutional: Negative.   HENT: Negative.   Eyes: Negative.   Respiratory: Negative.   Cardiovascular: Negative.   Gastrointestinal: Negative.   Genitourinary: Negative.   Musculoskeletal: Negative.   Skin: Negative.   Neurological: Negative.   Endo/Heme/Allergies: Negative.   Psychiatric/Behavioral: Negative for depression, suicidal ideas, hallucinations, memory loss and substance abuse. The patient is nervous/anxious. The patient does not have insomnia.        Manic symptoms    Blood pressure 113/78, pulse 86,  temperature 97.2 F (36.2 C), temperature source Oral, resp. rate 22, height 5\' 9"  (1.753 m), weight 82.101 kg (181 lb), SpO2 96.00%.Body mass index is 26.72 kg/(m^2).  General Appearance: Casual  Eye Contact::  Fair  Speech:  Pressured  Volume:  Increased  Mood:  Euphoric  Affect:  Congruent  Thought Process:  Disorganized  Orientation:  Full (Time, Place, and Person)  Thought Content:  WDL  Suicidal Thoughts:  No  Homicidal Thoughts:  No  Memory:  Immediate;   Fair Recent;   Fair Remote;   Fair  Judgement:  Impaired  Insight:  Lacking  Psychomotor Activity:  Decreased  Concentration:  Fair  Recall:  Fair  Akathisia:  No  Handed:  Right  AIMS (if indicated):     Assets:  Communication Skills Resilience  Sleep:  Number of Hours: 5.25   Current Medications: Current Facility-Administered Medications  Medication Dose Route Frequency Provider Last Rate Last Dose  . acetaminophen (TYLENOL) tablet 650 mg  650 mg Oral Q6H PRN Earney Navy, NP   650 mg at 11/23/12 1324  . alum & mag hydroxide-simeth (MAALOX/MYLANTA) 200-200-20 MG/5ML suspension 30 mL  30 mL Oral Q4H PRN Earney Navy, NP      . benzocaine (ORAJEL) 10 % mucosal gel   Mouth/Throat QID PRN Nehemiah Settle, MD      . dicyclomine (BENTYL) tablet 20 mg  20 mg Oral Q6H PRN Earney Navy, NP      .  divalproex (DEPAKOTE) DR tablet 500 mg  500 mg Oral Q12H Nanine Means, NP   500 mg at 11/23/12 0807  . docusate sodium (COLACE) capsule 100 mg  100 mg Oral BID PRN Nehemiah Settle, MD   100 mg at 11/21/12 0002  . ferrous sulfate tablet 325 mg  325 mg Oral Q breakfast Earney Navy, NP   325 mg at 11/23/12 0807  . hydrOXYzine (ATARAX/VISTARIL) tablet 25 mg  25 mg Oral Q6H PRN Earney Navy, NP   25 mg at 11/22/12 1003  . lithium carbonate capsule 600 mg  600 mg Oral QHS Mojeed Akintayo      . loperamide (IMODIUM) capsule 2-4 mg  2-4 mg Oral PRN Earney Navy, NP      . magnesium  hydroxide (MILK OF MAGNESIA) suspension 30 mL  30 mL Oral Daily PRN Earney Navy, NP      . methocarbamol (ROBAXIN) tablet 500 mg  500 mg Oral Q8H PRN Earney Navy, NP   500 mg at 11/22/12 0825  . mometasone-formoterol (DULERA) 100-5 MCG/ACT inhaler 2 puff  2 puff Inhalation BID Nanine Means, NP   2 puff at 11/23/12 0807  . multivitamin with minerals tablet 1 tablet  1 tablet Oral q morning - 10a Earney Navy, NP   1 tablet at 11/23/12 0806  . naproxen (NAPROSYN) tablet 500 mg  500 mg Oral BID PRN Earney Navy, NP   500 mg at 11/22/12 1357  . OLANZapine zydis (ZYPREXA) disintegrating tablet 15 mg  15 mg Oral Q8H PRN Mojeed Akintayo   15 mg at 11/22/12 2254  . ondansetron (ZOFRAN-ODT) disintegrating tablet 4 mg  4 mg Oral Q6H PRN Earney Navy, NP   4 mg at 11/21/12 0316  . QUEtiapine (SEROQUEL) tablet 150 mg  150 mg Oral QHS Earney Navy, NP   150 mg at 11/22/12 2100  . vitamin B-12 (CYANOCOBALAMIN) tablet 1,000 mcg  1,000 mcg Oral Daily Earney Navy, NP   1,000 mcg at 11/23/12 9147    Lab Results: No results found for this or any previous visit (from the past 48 hour(s)).  Physical Findings: AIMS: Facial and Oral Movements Muscles of Facial Expression: None, normal Lips and Perioral Area: None, normal Jaw: None, normal Tongue: None, normal,Extremity Movements Upper (arms, wrists, hands, fingers): None, normal Lower (legs, knees, ankles, toes): None, normal, Trunk Movements Neck, shoulders, hips: None, normal, Overall Severity Severity of abnormal movements (highest score from questions above): None, normal Incapacitation due to abnormal movements: None, normal Patient's awareness of abnormal movements (rate only patient's report): No Awareness, Dental Status Current problems with teeth and/or dentures?: Yes (hole upper L tooth) Does patient usually wear dentures?: No  CIWA:  CIWA-Ar Total: 0 COWS:  COWS Total Score: 4  Treatment Plan  Summary: Daily contact with patient to assess and evaluate symptoms and progress in treatment Medication management  Plan:  Review of chart, vital signs, medications, and notes. 1-Individual and group therapy 2-Medication management for mood instability and anxiety:   3-Coping skills for mood instability, anxiety, and mania 4-Continue crisis stabilization and management 5-Address health issues--monitoring vital signs, stable 6-Treatment plan in progress to prevent relapse of mood instability and anxiety 7- Continue Depakote ER 500mg  po BID and increase Seroquel to 200 mg at hs to further stabilize patient's mood. Zyprexa Zydis 15 mg available every eight hours as needed for acute agitation.  8- Change Lithium to 600mg  po at bedtime. 8-Order Lithium  and Depakote level for am blood draw.  Medical Decision Making Problem Points:  Review of psycho-social stressors (1) Data Points:  Review of new medications or change in dosage (2)  I certify that inpatient services furnished can reasonably be expected to improve the patient's condition.   Fransisca Kaufmann, NP-C 11/23/2012, 2:17 PM

## 2012-11-23 NOTE — [facility] Group Notes (Signed)
Adult Psychoeducational Group Note  Date:  11/23/2012 Time:  9:27 PM  Group Topic/Focus:  Wrap-Up Group:   The focus of this group is to help patients review their daily goal of treatment and discuss progress on daily workbooks.  Participation Level:  Did Not Attend  Participation Quality:  None  Affect:  None  Cognitive:  None  Insight: None  Engagement in Group:  None  Modes of Intervention:  Discussion  Additional Comments:  Giana did not attend group.  Caroll Rancher A 11/23/2012, 9:27 PM

## 2012-11-24 LAB — VALPROIC ACID LEVEL: Valproic Acid Lvl: 53.5 ug/mL (ref 50.0–100.0)

## 2012-11-24 LAB — LITHIUM LEVEL: Lithium Lvl: 0.42 mEq/L — ABNORMAL LOW (ref 0.80–1.40)

## 2012-11-24 MED ORDER — HYDROXYZINE HCL 25 MG PO TABS
25.0000 mg | ORAL_TABLET | Freq: Four times a day (QID) | ORAL | Status: DC | PRN
  Administered 2012-11-25: 25 mg via ORAL

## 2012-11-24 MED ORDER — OLANZAPINE 10 MG PO TBDP
10.0000 mg | ORAL_TABLET | Freq: Two times a day (BID) | ORAL | Status: DC
  Administered 2012-11-24 – 2012-11-26 (×4): 10 mg via ORAL
  Filled 2012-11-24: qty 1
  Filled 2012-11-24 (×2): qty 6
  Filled 2012-11-24 (×6): qty 1

## 2012-11-24 MED ORDER — HYDROXYZINE HCL 25 MG PO TABS: 25.0000 mg | ORAL_TABLET | Freq: Three times a day (TID) | ORAL | Status: DC

## 2012-11-24 NOTE — Progress Notes (Signed)
D: Patient appeared very agitated at the beginning of the shift. She was very loud, angry and cursing staff out. She went to her room and told staffs to keep off.  Writer went in patient room could of time to talk to her; but patient yelled and told writer to go away; "you all are mean nurses". She later apologized for her behavior and  reported that one of the staff promised to give her a pair of shoe earlier and didn't give it. Patient was very demanding, she requested that the writer transfer her to [facility] to receive steroid shot; "I get that every week for my pain".  A: Writer administered  Prn Zyprexa for agitation and also gave patient her prn Robaxin for pain. Administered all HS medications as ordered. Encouraged and supported patient. R: Patient receptive to encouragement and support. Q 15 minute check continues as ordered to maintain safety.

## 2012-11-24 NOTE — Progress Notes (Signed)
Patient ID: [name], female   DOB: 07-31-1960, 52 y.o.   MRN: [id number] [facility] MD Progress Note  11/24/2012 10:55 AM [name]  MRN:  [id number] Subjective: "I thinks am ready to go home." Objective: Patient reports that her medication is working well but she remains argumentative, verbally aggressive, extremely labile and manic. Her thought process is getting more organized, she is now taking her medications and has not endorsed any adverse reactions. Diagnosis:   Axis I: Bipolar 1 disorder recent episode Manic           Cannabis use disorder Axis II: Deferred Axis III:  Past Medical History  Diagnosis Date  . Emphysema   . Polysubstance abuse   . Bronchitis   . Emphysema   . Asthma   . Hypertension    Axis IV: other psychosocial or environmental problems, problems related to social environment and problems with primary support group Axis V: 41-50 serious symptoms  ADL's:  Intact  Sleep: Fair  Appetite:  Good  Suicidal Ideation:  Denies Homicidal Ideation:  Denies  Psychiatric Specialty Exam: Review of Systems  Constitutional: Negative.   HENT: Negative.   Eyes: Negative.   Respiratory: Negative.   Cardiovascular: Negative.   Gastrointestinal: Negative.   Genitourinary: Negative.   Musculoskeletal: Negative.   Skin: Negative.   Neurological: Negative.   Endo/Heme/Allergies: Negative.   Psychiatric/Behavioral:       Manic symptoms    Blood pressure 113/78, pulse 86, temperature 97.2 F (36.2 C), temperature source Oral, resp. rate 22, height 5\' 9"  (1.753 m), weight 82.101 kg (181 lb), SpO2 96.00%.Body mass index is 26.72 kg/(m^2).  General Appearance: Casual  Eye Contact::  Fair  Speech:  Pressured  Volume:  Increased  Mood:  Euphoric  Affect:  Congruent  Thought Process:  Disorganized  Orientation:  Full (Time, Place, and Person)  Thought Content:  WDL  Suicidal Thoughts:  No  Homicidal Thoughts:  No  Memory:  Immediate;   Fair Recent;    Fair Remote;   Fair  Judgement:  Impaired  Insight:  Lacking  Psychomotor Activity:  Decreased  Concentration:  Fair  Recall:  Fair  Akathisia:  No  Handed:  Right  AIMS (if indicated):     Assets:  Communication Skills Resilience  Sleep:  Number of Hours: 5.25   Current Medications: Current Facility-Administered Medications  Medication Dose Route Frequency Provider Last Rate Last Dose  . acetaminophen (TYLENOL) tablet 650 mg  650 mg Oral Q6H PRN Earney Navy, NP   650 mg at 11/23/12 1324  . alum & mag hydroxide-simeth (MAALOX/MYLANTA) 200-200-20 MG/5ML suspension 30 mL  30 mL Oral Q4H PRN Earney Navy, NP      . benzocaine (ORAJEL) 10 % mucosal gel   Mouth/Throat QID PRN Nehemiah Settle, MD      . dicyclomine (BENTYL) tablet 20 mg  20 mg Oral Q6H PRN Earney Navy, NP      . divalproex (DEPAKOTE) DR tablet 500 mg  500 mg Oral Q12H Nanine Means, NP   500 mg at 11/24/12 0815  . docusate sodium (COLACE) capsule 100 mg  100 mg Oral BID PRN Nehemiah Settle, MD   100 mg at 11/21/12 0002  . ferrous sulfate tablet 325 mg  325 mg Oral Q breakfast Earney Navy, NP   325 mg at 11/24/12 0815  . hydrOXYzine (ATARAX/VISTARIL) tablet 25 mg  25 mg Oral Q6H PRN Krue Peterka      .  lithium carbonate capsule 600 mg  600 mg Oral QHS Simara Rhyner   600 mg at 11/23/12 2122  . loperamide (IMODIUM) capsule 2-4 mg  2-4 mg Oral PRN Earney Navy, NP      . magnesium hydroxide (MILK OF MAGNESIA) suspension 30 mL  30 mL Oral Daily PRN Earney Navy, NP      . methocarbamol (ROBAXIN) tablet 500 mg  500 mg Oral Q8H PRN Earney Navy, NP   500 mg at 11/23/12 2053  . mometasone-formoterol (DULERA) 100-5 MCG/ACT inhaler 2 puff  2 puff Inhalation BID Nanine Means, NP   2 puff at 11/24/12 0816  . multivitamin with minerals tablet 1 tablet  1 tablet Oral q morning - 10a Fransisca Kaufmann, NP   1 tablet at 11/24/12 0815  . naproxen (NAPROSYN) tablet 500 mg  500 mg  Oral BID PRN Earney Navy, NP   500 mg at 11/22/12 1357  . OLANZapine zydis (ZYPREXA) disintegrating tablet 10 mg  10 mg Oral BID PC Liliann File      . OLANZapine zydis (ZYPREXA) disintegrating tablet 15 mg  15 mg Oral Q8H PRN Raine Blodgett   15 mg at 11/23/12 2022  . ondansetron (ZOFRAN-ODT) disintegrating tablet 4 mg  4 mg Oral Q6H PRN Earney Navy, NP   4 mg at 11/21/12 0316  . QUEtiapine (SEROQUEL) tablet 200 mg  200 mg Oral QHS Fransisca Kaufmann, NP   200 mg at 11/23/12 2122  . vitamin B-12 (CYANOCOBALAMIN) tablet 1,000 mcg  1,000 mcg Oral Daily Earney Navy, NP   1,000 mcg at 11/24/12 1610    Lab Results:  Results for orders placed during the hospital encounter of 11/19/12 (from the past 48 hour(s))  LITHIUM LEVEL     Status: Abnormal   Collection Time    11/24/12  6:20 AM      Result Value Range   Lithium Lvl 0.42 (*) 0.80 - 1.40 mEq/L   Comment: Performed at [facility]    Physical Findings: AIMS: Facial and Oral Movements Muscles of Facial Expression: None, normal Lips and Perioral Area: None, normal Jaw: None, normal Tongue: None, normal,Extremity Movements Upper (arms, wrists, hands, fingers): None, normal Lower (legs, knees, ankles, toes): None, normal, Trunk Movements Neck, shoulders, hips: None, normal, Overall Severity Severity of abnormal movements (highest score from questions above): None, normal Incapacitation due to abnormal movements: None, normal Patient's awareness of abnormal movements (rate only patient's report): No Awareness, Dental Status Current problems with teeth and/or dentures?: Yes (hole upper L tooth) Does patient usually wear dentures?: No  CIWA:  CIWA-Ar Total: 3 COWS:  COWS Total Score: 4  Treatment Plan Summary: Daily contact with patient to assess and evaluate symptoms and progress in treatment Medication management  Plan:  Review of chart, vital signs, medications, and notes. 1-Individual and group  therapy 2-Medication management for mood instability and anxiety:   3-Coping skills for mood instability, anxiety, and mania 4-Continue crisis stabilization and management 5-Address health issues--monitoring vital signs, stable 6-Treatment plan in progress to prevent relapse of mood instability and anxiety 7- Continue Depakote ER 500mg  po BID for labile mood 8- Continue  Lithium  600mg  po at bedtime for mood lability 9. Add Zyprexa 10mg  po BID for agitated mood Medical Decision Making Problem Points:  Review of psycho-social stressors (1) Data Points:  Review of new medications or change in dosage (2)  I certify that inpatient services furnished can reasonably be expected to improve  the patient's condition.   Srihan Brutus,MD 11/24/2012, 10:55 AM

## 2012-11-24 NOTE — [facility] Group Notes (Signed)
[facility] [employer] Group Therapy  11/24/2012 , 1:47 PM    Type of Therapy:  [employer] Presentation  Participation Level:  Active  Participation Quality:  Attentive  Affect:  Excited  Cognitive:  Oriented  Insight:  Limited  Engagement in Therapy:  Limited  Modes of Intervention:  Discussion, Education and Socialization  Summary of Progress/Problems:  Onalee Hua from [employer] came to present his recovery story and play the guitar.  Zhoey was activated in group in between nodding off sessions.  When awake, she asked a lot of questions and shared a lot of information, to the determent of the other group members.  Limited self awareness.  Daryel Gerald B 11/24/2012 , 1:47 PM

## 2012-11-24 NOTE — B[facility] Group Notes (Signed)
Adult Psychoeducational Group Note  Date:  11/24/2012 Time:  10:29 PM  Group Topic/Focus:  Wrap-Up Group:   The focus of this group is to help patients review their daily goal of treatment and discuss progress on daily workbooks.  Participation Level:  Active  Participation Quality:  Appropriate  Affect:  Flat  Cognitive:  Appropriate  Insight: Appropriate  Engagement in Group:  Engaged  Modes of Intervention:  Discussion  Additional Comments:  Bria said he had a good day.  She watched some of her favorite things on television.  She also stated she goes home Friday.  Caroll Rancher A 11/24/2012, 10:29 PM

## 2012-11-24 NOTE — Progress Notes (Signed)
Recreation Therapy Notes  Date: 08.06.2014 Time: 9:30am Location: [address] Dayroom  Group Topic: Leisure Education  Goal Area(s) Addresses:  Patient will verbalize activity of interest by end of group session. Patient will verbalize the ability to use positive leisure/recreation as a coping mechanism.  Behavioral Response: Engaged, Appropriate,   Intervention: Game  Activity: Adapted On Deck. Patient rolled large di, if patient rolled 1-3 they were asked to select leisure/recreation activity from container and act it out. If patient rolled 4-6 patient was asked to select leisure/recreation activity from container and draw it on white board.   Education:  Pharmacologist, Relationship Building, Discharge Planning  Education Outcome: Needs additional education  Clinical Observations/Feedback: Patient actively engaged in group session. Patient contributed to opening discussion, sharing that she enjoys playing cards games in her spare time, as well as assisting group with definition of leisure. Patient appropriately acted out leisure/recreation activities for peers to guess. Patient was asked to leave session by LCSW to attend treatment team at approximately 9:45am, as patient returned to group session group was ending.   Marykay Lex Rumaysa Sabatino, LRT/CTRS  Jonavon Trieu L 11/24/2012 1:02 PM

## 2012-11-24 NOTE — Progress Notes (Signed)
D: Patient denies SI/HI and auditory and visual hallucinations. The patient has an anxious mood and affect. The patient states to writer that she is "ready to leave" and that she needs to make a "appointment to fix my hip!" Patient is hyperverbal at times and is restless on the unit and can be seen pacing occasionally. The patient has been noted to have anger outbursts/irritable moments but is easily redirected after these events.  A: Patient given emotional support from RN. Patient encouraged to come to staff with concerns and/or questions. Patient's medication routine continued. Patient's orders and plan of care reviewed. Notified MD/NP about patient's concerns for discharge.  R: Patient remains cooperative. Will continue to monitor patient q15 minutes for safety.

## 2012-11-24 NOTE — Progress Notes (Signed)
D: Patient in day room at the beginning of the shift. She reported feeling better and having a good day; "I have been fine, I have been chilling, everything is O.K". She denied SI/HI and denied hallucinations. Shortly after the assessment, patient requested for Anbesol for tooth ache. A: Writer administered Anbesol and Tylenol to the patient.She tolerated medication. R: Patient received medications without difficulty; Q 15 minute check continues as ordered to maintain safety.

## 2012-11-24 NOTE — B[facility] Group Notes (Signed)
[facility] LCSW Aftercare Discharge Planning Group Note   11/24/2012 1:39 PM  Participation Quality:  Engaged  Mood/Affect:  Excited  Depression Rating:  3  Anxiety Rating:  4  Thoughts of Suicide:  No Will you contract for safety?   NA  Current AVH:  No  Plan for Discharge/Comments:  States she is doing so much better due to back on meds and getting a new "anxiety med."  She does not know what it is.  Says she rescheduled her med appointment for today, and wants to d/c to attend that.  Let her know she would need to see Dr about that.  Later, she handled it well when informed by Dr that she would not be d/ced until Friday.  States she plans on following up [facility] in [place].  Transportation Means: friend  Supports:  friend  [place], [place] B

## 2012-11-25 NOTE — Progress Notes (Signed)
[facility] Adult Case Management Discharge Plan :  Will you be returning to the same living situation after discharge: Yes,  home At discharge, do you have transportation home?:Yes,  neighbor Do you have the ability to pay for your medications:Yes,  MCD  Release of information consent forms completed and in the chart;  Patient's signature needed at discharge.  Patient to Follow up at: Follow-up Information   Follow up with [facility] On 12/01/2012. (3:00PM for your hospital follow up appointment)    Contact information:   [address]  [place] [phone]      Patient denies SI/HI:   Yes,  yes    Safety Planning and Suicide Prevention discussed:  Yes,  yes  Ida Rogue 11/25/2012, 3:38 PM

## 2012-11-25 NOTE — Tx Team (Signed)
  Interdisciplinary Treatment Plan Update   Date Reviewed:  11/25/2012  Time Reviewed:  3:34 PM  Progress in Treatment:   Attending groups: Yes Participating in groups: Yes Taking medication as prescribed: Yes  Tolerating medication: Yes Family/Significant other contact made: Yes  Patient understands diagnosis: Yes  Discussing patient identified problems/goals with staff: Yes Medical problems stabilized or resolved: Yes Denies suicidal/homicidal ideation: Yes Patient has not harmed self or others: Yes  For review of initial/current patient goals, please see plan of care.  Estimated Length of Stay:  D/C tomorrow  Reason for Continuation of Hospitalization:   New Problems/Goals identified:  N/A  Discharge Plan or Barriers:   return home, follow up outpt  Additional Comments:  Attendees:  Signature: Thedore Mins, MD 11/25/2012 3:34 PM   Signature: Richelle Ito, LCSW 11/25/2012 3:34 PM  Signature: Fransisca Kaufmann, NP 11/25/2012 3:34 PM  Signature: Joslyn Devon, RN 11/25/2012 3:34 PM  Signature:  11/25/2012 3:34 PM  Signature:  11/25/2012 3:34 PM  Signature:   11/25/2012 3:34 PM  Signature:    Signature:    Signature:    Signature:    Signature:    Signature:      Scribe for Treatment Team:   Richelle Ito, LCSW  11/25/2012 3:34 PM

## 2012-11-25 NOTE — [facility] Group Notes (Signed)
[facility] Group Notes:  (Counselor/Nursing/MHT/Case Management/Adjunct)  11/25/2012 1:15PM  Type of Therapy:  Group Therapy  Participation Level:  Active  Participation Quality:  Appropriate  Affect:  Flat  Cognitive:  Oriented  Insight:  Improving  Engagement in Group:  Limited  Engagement in Therapy:  Limited  Modes of Intervention:  Discussion, Exploration and Socialization  Summary of Progress/Problems: The topic for group was balance in life.  Pt participated in the discussion about when their life was in balance and out of balance and how this feels.  Pt discussed ways to get back in balance and short term goals they can work on to get where they want to be.  Chanelle states she is balanced today, and knows this because she is calm.  She is calm because she is taking her medications that help with anxietym, and is away from people who make her anxious.  She moves to balance by finding a place alone, and taking a nap.   Daryel Gerald B 11/25/2012 2:28 PM

## 2012-11-25 NOTE — [facility] Suicide Risk Assessment (Signed)
[facility] INPATIENT:  Family/Significant Other Suicide Prevention Education  Suicide Prevention Education:  Education Completed; No one has been identified by the patient as the family member/significant other with whom the patient will be residing, and identified as the person(s) who will aid the patient in the event of a mental health crisis (suicidal ideations/suicide attempt).  With written consent from the patient, the family member/significant other has been provided the following suicide prevention education, prior to the and/or following the discharge of the patient.  The suicide prevention education provided includes the following:  Suicide risk factors  Suicide prevention and interventions  National Suicide Hotline telephone number  [facility] assessment telephone number  [facility] Emergency Assistance 911  [facility] and/or Residential Mobile Crisis Unit telephone number  Request made of family/significant other to:  Remove weapons (e.g., guns, rifles, knives), all items previously/currently identified as safety concern.    Remove drugs/medications (over-the-counter, prescriptions, illicit drugs), all items previously/currently identified as a safety concern.  The family member/significant other verbalizes understanding of the suicide prevention education information provided.  The family member/significant other agrees to remove the items of safety concern listed above.  The patient did not endorse SI at the time of admission, nor did the patient c/o SI during the stay here.  SPE not required.   Daryel Gerald B 11/25/2012, 11:29 AM

## 2012-11-25 NOTE — Progress Notes (Signed)
Per Karleen Hampshire the do not admit can be removed. Pt [name] had to be moved into pt room.

## 2012-11-25 NOTE — Progress Notes (Addendum)
Patient agitated and states that she is "in pain and wants a muscle relaxer now." RN checked patient's MAR and informed her that she did not have a muscle relaxer available to give (it had been discontinued) and that she could receive tylenol. Patient became irate and started yelling at RN that she "knew what she could have" and that she wanted a muscle relaxer. RN explained to patient that this was not appropriate behavior when trying to obtain medication and that if patient would stop yelling she could receive the tylenol. MD/NP notified of patient's behavior and RNs actions. RN attempted to return to patient at 1100 and administer tylenol. Patient refused and started screaming at RN again. Will continue to monitor patient for safety.

## 2012-11-25 NOTE — Progress Notes (Signed)
D: Patient denies SI/HI and auditory and visual hallucinations. The patient still has an anxious and irritable mood and affect. The patient is still requesting discharge but states that she is aware that it "probably won't be today." The patient is restless on the unit and can become agitated with staff for what she perceives as insults (a MHT turning away from her, an RN telling her that he will make MD aware of her concerns, etc.) During these outbursts the patient is easily redirected. The patient is attending groups and interacting appropriately within the milieu.  A: Patient given emotional support from RN. Patient encouraged to come to staff with concerns and/or questions. Patient's medication routine continued. Patient's orders and plan of care reviewed.  R: Patient remains cooperative. Will continue to monitor patient q15 minutes for safety.

## 2012-11-25 NOTE — Progress Notes (Signed)
Patient ID: [name] [place], female   DOB: [birth year], 52 y.o.   MRN: [id number] [facility] MD Progress Note  11/25/2012 10:14 AM BRAELIN BROSCH  MRN:  [id number] Subjective: "I have pain all over my body." Objective: Patient reports decreased mood swings, agitation and delusions. Her thought process is more organized and says she has been sleeping better. She is compliant with her medications and denies any adverse reactions to it. Diagnosis:   Axis I: Bipolar 1 disorder recent episode Manic           Cannabis use disorder Axis II: Deferred Axis III:  Past Medical History  Diagnosis Date  . Emphysema   . Polysubstance abuse   . Bronchitis   . Emphysema   . Asthma   . Hypertension    Axis IV: other psychosocial or environmental problems, problems related to social environment and problems with primary support group Axis V: 50-60 moderate symptoms  ADL's:  Intact  Sleep: Fair  Appetite:  Good  Suicidal Ideation:  Denies Homicidal Ideation:  Denies  Psychiatric Specialty Exam: Review of Systems  Constitutional: Negative.   HENT: Negative.   Eyes: Negative.   Respiratory: Negative.   Cardiovascular: Negative.   Gastrointestinal: Negative.   Genitourinary: Negative.   Musculoskeletal: Negative.   Skin: Negative.   Neurological: Negative.   Endo/Heme/Allergies: Negative.   Psychiatric/Behavioral:       Manic symptoms    Blood pressure 114/81, pulse 100, temperature 97.8 F (36.6 C), temperature source Oral, resp. rate 18, height 5\' 9"  (1.753 m), weight 82.101 kg (181 lb), SpO2 96.00%.Body mass index is 26.72 kg/(m^2).  General Appearance: Casual  Eye Contact::  Fair  Speech:  Pressured  Volume:  Increased  Mood:  Euphoric  Affect:  Congruent  Thought Process:  Linear  Orientation:  Full (Time, Place, and Person)  Thought Content:  WDL  Suicidal Thoughts:  No  Homicidal Thoughts:  No  Memory:  Immediate;   Fair Recent;   Fair Remote;   Fair  Judgement:  marginal   Insight:  marginal  Psychomotor Activity:  increased  Concentration:  Fair  Recall:  Fair  Akathisia:  No  Handed:  Right  AIMS (if indicated):     Assets:  Communication Skills Resilience  Sleep:  Number of Hours: 5.75   Current Medications: Current Facility-Administered Medications  Medication Dose Route Frequency Provider Last Rate Last Dose  . acetaminophen (TYLENOL) tablet 650 mg  650 mg Oral Q6H PRN Earney Navy, NP   650 mg at 11/24/12 1957  . alum & mag hydroxide-simeth (MAALOX/MYLANTA) 200-200-20 MG/5ML suspension 30 mL  30 mL Oral Q4H PRN Earney Navy, NP      . benzocaine (ORAJEL) 10 % mucosal gel   Mouth/Throat QID PRN Nehemiah Settle, MD      . divalproex (DEPAKOTE) DR tablet 500 mg  500 mg Oral Q12H Nanine Means, NP   500 mg at 11/25/12 0738  . docusate sodium (COLACE) capsule 100 mg  100 mg Oral BID PRN Nehemiah Settle, MD   100 mg at 11/21/12 0002  . ferrous sulfate tablet 325 mg  325 mg Oral Q breakfast Earney Navy, NP   325 mg at 11/25/12 0738  . hydrOXYzine (ATARAX/VISTARIL) tablet 25 mg  25 mg Oral Q6H PRN Cornel Werber      . lithium carbonate capsule 600 mg  600 mg Oral QHS Arianna Delsanto   600 mg at 11/24/12 2116  . magnesium hydroxide (MILK  OF MAGNESIA) suspension 30 mL  30 mL Oral Daily PRN Earney Navy, NP      . mometasone-formoterol (DULERA) 100-5 MCG/ACT inhaler 2 puff  2 puff Inhalation BID Nanine Means, NP   2 puff at 11/25/12 0740  . multivitamin with minerals tablet 1 tablet  1 tablet Oral q morning - 10a Fransisca Kaufmann, NP   1 tablet at 11/25/12 [phone]  . OLANZapine zydis (ZYPREXA) disintegrating tablet 10 mg  10 mg Oral BID PC Shykeria Sakamoto   10 mg at 11/25/12 0739  . OLANZapine zydis (ZYPREXA) disintegrating tablet 15 mg  15 mg Oral Q8H PRN Ginamarie Banfield   15 mg at 11/23/12 2022  . QUEtiapine (SEROQUEL) tablet 200 mg  200 mg Oral QHS Fransisca Kaufmann, NP   200 mg at 11/24/12 2116  . vitamin B-12  (CYANOCOBALAMIN) tablet 1,000 mcg  1,000 mcg Oral Daily Earney Navy, NP   1,000 mcg at 11/25/12 9811    Lab Results:  Results for orders placed during the hospital encounter of 11/19/12 (from the past 48 hour(s))  LITHIUM LEVEL     Status: Abnormal   Collection Time    11/24/12  6:20 AM      Result Value Range   Lithium Lvl 0.42 (*) 0.80 - 1.40 mEq/L   Comment: Performed at [facility]  VALPROIC ACID LEVEL     Status: None   Collection Time    11/24/12  6:20 AM      Result Value Range   Valproic Acid Lvl 53.5  50.0 - 100.0 ug/mL   Comment: Performed at [facility]    Physical Findings: AIMS: Facial and Oral Movements Muscles of Facial Expression: None, normal Lips and Perioral Area: None, normal Jaw: None, normal Tongue: None, normal,Extremity Movements Upper (arms, wrists, hands, fingers): None, normal Lower (legs, knees, ankles, toes): None, normal, Trunk Movements Neck, shoulders, hips: None, normal, Overall Severity Severity of abnormal movements (highest score from questions above): None, normal Incapacitation due to abnormal movements: None, normal Patient's awareness of abnormal movements (rate only patient's report): No Awareness, Dental Status Current problems with teeth and/or dentures?: Yes (hole upper L tooth) Does patient usually wear dentures?: No  CIWA:  CIWA-Ar Total: 2 COWS:  COWS Total Score: 4  Treatment Plan Summary: Daily contact with patient to assess and evaluate symptoms and progress in treatment Medication management  Plan:  Review of chart, vital signs, medications, and notes. 1-Individual and group therapy 2-Medication management for mood instability and anxiety:   3-Coping skills for mood instability, anxiety, and mania 4-Continue crisis stabilization and management 5-Address health issues--monitoring vital signs, stable 6-Treatment plan in progress to prevent relapse of mood instability and anxiety 7-  Continue Depakote ER 500mg  po BID for labile mood 8- Continue  Lithium  600mg  po at bedtime for mood lability 9. Continue Zyprexa 10mg  po BID for agitated mood 10. Lithium and Valproic level on 11/26/12 11. Possible D/C tomorrow. Medical Decision Making Problem Points:  Review of psycho-social stressors (1) Data Points:  Review of new medications or change in dosage (2)  I certify that inpatient services furnished can reasonably be expected to improve the patient's condition.   Linnae Rasool,MD 11/25/2012, 10:14 AM

## 2012-11-26 LAB — VALPROIC ACID LEVEL: Valproic Acid Lvl: 53 ug/mL (ref 50.0–100.0)

## 2012-11-26 MED ORDER — OLANZAPINE 10 MG PO TBDP: 10.0000 mg | ORAL_TABLET | Freq: Two times a day (BID) | ORAL | Status: DC

## 2012-11-26 MED ORDER — FERROUS SULFATE 325 (65 FE) MG PO TABS: 325.0000 mg | ORAL_TABLET | Freq: Every day | ORAL | Status: DC | PRN

## 2012-11-26 MED ORDER — FLUTICASONE-SALMETEROL 100-50 MCG/DOSE IN AEPB: 1.0000 | INHALATION_SPRAY | Freq: Two times a day (BID) | RESPIRATORY_TRACT | Status: DC

## 2012-11-26 MED ORDER — DIVALPROEX SODIUM 500 MG PO DR TAB: 500.0000 mg | DELAYED_RELEASE_TABLET | Freq: Two times a day (BID) | ORAL | Status: DC

## 2012-11-26 MED ORDER — ADULT MULTIVITAMIN W/MINERALS CH: 1.0000 | ORAL_TABLET | Freq: Every morning | ORAL | Status: DC

## 2012-11-26 MED ORDER — VITAMIN B-12 1000 MCG PO TABS: 1000.0000 ug | ORAL_TABLET | Freq: Every day | ORAL | Status: DC

## 2012-11-26 MED ORDER — LITHIUM CARBONATE 600 MG PO CAPS: 600.0000 mg | ORAL_CAPSULE | Freq: Every day | ORAL | Status: DC
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
## 2012-11-26 NOTE — Progress Notes (Signed)
Patient ID: [name], female   DOB: 1961/04/18, 52 y.o.   MRN: [id number] Patient denies si/hi/avh. Pt verbalizes understanding of discharge instructions, prescriptions and follow up appts. Pt signed for their belongings from locker. Pt was walked to the lobby and was transported by family/friend named george.
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
## 2012-11-26 NOTE — Progress Notes (Signed)
Recreation Therapy Notes  Date: 08.08.2014 Time: 10:30am Location: [address] Dayroom  Group Topic: Coping Skills  Goal Area(s) Addresses:  Patient will effectively express themselves using art. Patient will verbalized benefit of expressing themselves through art.   Behavioral Response: Did not attend  Jearl Klinefelter, LRT/CTRS  Jearl Klinefelter 11/26/2012 11:53 AM

## 2012-11-26 NOTE — [facility] Suicide Risk Assessment (Signed)
Suicide Risk Assessment  Discharge Assessment     Demographic Factors:  Low socioeconomic status, Unemployed and female  Mental Status Per Nursing Assessment::   On Admission:  NA  Current Mental Status by Physician: patient denies suicidal ideation, intent or plan  Loss Factors: Financial problems/change in socioeconomic status  Historical Factors: Impulsivity  Risk Reduction Factors:   Sense of responsibility to family, Living with another person, especially a relative and Positive social support  Continued Clinical Symptoms:  Bipolar I disorder, most recent episode (or current) manic   Cognitive Features That Contribute To Risk:  Closed-mindedness Polarized thinking    Suicide Risk:  Minimal: No identifiable suicidal ideation.  Patients presenting with no risk factors but with morbid ruminations; may be classified as minimal risk based on the severity of the depressive symptoms  Discharge Diagnoses:   AXIS I:  Bipolar I disorder, most recent episode (or current) manic  AXIS II:  Deferred AXIS III:   Past Medical History  Diagnosis Date  . Polysubstance abuse   . Bronchitis   . Emphysema   . Asthma   . Hypertension    AXIS IV:  other psychosocial or environmental problems and problems related to social environment AXIS V:  61-70 mild symptoms  Plan Of Care/Follow-up recommendations:  Activity:  as tolerated Diet:  healthy Tests:  Lithium level and Valproic acid level  Other:  patient to keep her after care appointment  Is patient on multiple antipsychotic therapies at discharge:  No   Has Patient had three or more failed trials of antipsychotic monotherapy by history:  No  Recommended Plan for Multiple Antipsychotic Therapies: N/A  Isabella Coltrane,MD 11/26/2012, 9:04 AM

## 2012-11-28 ENCOUNTER — Encounter (HOSPITAL_COMMUNITY): Payer: Self-pay | Admitting: *Deleted

## 2012-11-28 ENCOUNTER — Emergency Department (HOSPITAL_COMMUNITY)
Admission: EM | Admit: 2012-11-28 | Discharge: 2012-11-29 | Disposition: A | Discharge status: Home / Self Care | Payer: Medicaid Other | Attending: Emergency Medicine | Admitting: Emergency Medicine

## 2012-11-28 DIAGNOSIS — F17200 Nicotine dependence, unspecified, uncomplicated: Secondary | ICD-10-CM | POA: Insufficient documentation

## 2012-11-28 DIAGNOSIS — I10 Essential (primary) hypertension: Secondary | ICD-10-CM | POA: Insufficient documentation

## 2012-11-28 DIAGNOSIS — F209 Schizophrenia, unspecified: Secondary | ICD-10-CM | POA: Insufficient documentation

## 2012-11-28 DIAGNOSIS — Z79899 Other long term (current) drug therapy: Secondary | ICD-10-CM | POA: Insufficient documentation

## 2012-11-28 DIAGNOSIS — G8929 Other chronic pain: Secondary | ICD-10-CM | POA: Insufficient documentation

## 2012-11-28 DIAGNOSIS — Z8709 Personal history of other diseases of the respiratory system: Secondary | ICD-10-CM | POA: Insufficient documentation

## 2012-11-28 DIAGNOSIS — Z791 Long term (current) use of non-steroidal anti-inflammatories (NSAID): Secondary | ICD-10-CM | POA: Insufficient documentation

## 2012-11-28 DIAGNOSIS — M1610 Unilateral primary osteoarthritis, unspecified hip: Secondary | ICD-10-CM | POA: Insufficient documentation

## 2012-11-28 DIAGNOSIS — J45909 Unspecified asthma, uncomplicated: Secondary | ICD-10-CM | POA: Insufficient documentation

## 2012-11-28 DIAGNOSIS — F319 Bipolar disorder, unspecified: Secondary | ICD-10-CM | POA: Insufficient documentation

## 2012-11-28 DIAGNOSIS — Z8659 Personal history of other mental and behavioral disorders: Secondary | ICD-10-CM | POA: Insufficient documentation

## 2012-11-28 DIAGNOSIS — M1611 Unilateral primary osteoarthritis, right hip: Secondary | ICD-10-CM

## 2012-11-28 DIAGNOSIS — R51 Headache: Secondary | ICD-10-CM | POA: Insufficient documentation

## 2012-11-28 DIAGNOSIS — M169 Osteoarthritis of hip, unspecified: Secondary | ICD-10-CM | POA: Insufficient documentation
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
## 2012-11-29 ENCOUNTER — Encounter (HOSPITAL_COMMUNITY): Payer: Self-pay

## 2012-11-29 ENCOUNTER — Emergency Department (HOSPITAL_COMMUNITY)
Admission: EM | Admit: 2012-11-29 | Discharge: 2012-11-29 | Disposition: A | Discharge status: Home / Self Care | Payer: Medicaid Other | Source: Home / Self Care | Attending: Family Medicine | Admitting: Family Medicine

## 2012-11-29 DIAGNOSIS — M25559 Pain in unspecified hip: Secondary | ICD-10-CM

## 2012-11-29 MED ORDER — KETOROLAC TROMETHAMINE 60 MG/2ML IM SOLN
60.0000 mg | Freq: Once | INTRAMUSCULAR | Status: AC
  Administered 2012-11-29: 60 mg via INTRAMUSCULAR

## 2012-11-29 MED ORDER — NAPROXEN 500 MG PO TABS: 500.0000 mg | ORAL_TABLET | Freq: Two times a day (BID) | ORAL | Status: DC

## 2012-11-29 MED ORDER — KETOROLAC TROMETHAMINE 60 MG/2ML IM SOLN
60.0000 mg | Freq: Once | INTRAMUSCULAR | Status: AC
  Administered 2012-11-29: 60 mg via INTRAMUSCULAR
  Filled 2012-11-29: qty 2

## 2012-11-29 MED ORDER — DEXAMETHASONE SODIUM PHOSPHATE 10 MG/ML IJ SOLN
10.0000 mg | Freq: Once | INTRAMUSCULAR | Status: AC
  Administered 2012-11-29: 10 mg via INTRAMUSCULAR
  Filled 2012-11-29: qty 1

## 2012-11-29 MED ORDER — KETOROLAC TROMETHAMINE 60 MG/2ML IM SOLN
INTRAMUSCULAR | Status: AC
  Filled 2012-11-29: qty 2

## 2012-11-29 NOTE — ED Notes (Signed)
On release, pt states she feels better "It's a miracle!"

## 2012-11-29 NOTE — Discharge Summary (Signed)
Seen and agreed. Deshaun Weisinger, MD 

## 2012-11-29 NOTE — ED Provider Notes (Signed)
CSN: [id number]     Arrival date & time 11/29/12  1507 History     First MD Initiated Contact with Patient 11/29/12 1551     Chief Complaint  Patient presents with  . Hip Pain   (Consider location/radiation/quality/duration/timing/severity/associated sxs/prior Treatment) HPI Comments: 52 year old female presents complaining of severe hip pain. This is been going on for some time now. She states she has seen an orthopedic surgeon and she needs to have both hips replaced. She reports improvement in the pain with cortisone injections. She also says she has to have a shot of Dilaudid because that is all that will help with her pain. She says the Dilaudid took her pain away for a week last time they gave it to her. She was seen in the emergency department late last night/this morning and was treated with a shot of Toradol which she states that the pain away for about 6 hours. She denies any recent change in the pain. She states she is only here because it is going to be so long until she can get in to see orthopedic surgery, she says she has an appointment in 2 weeks. The pain is in both hips and groins going down her legs bilaterally. She denies fever, chills, loss of bowel or bladder control. Denies back pain.  Patient is a 52 y.o. female presenting with hip pain.  Hip Pain Pertinent negatives include no chest pain, no abdominal pain and no shortness of breath.    Past Medical History  Diagnosis Date  . Bipolar 1 disorder   . Emphysema   . Polysubstance abuse   . Bronchitis   . Emphysema   . Asthma   . Hypertension   . Schizophrenia    Past Surgical History  Procedure Laterality Date  . Abdominal hysterectomy    . Knee surgery      laser to left  . Appendectomy     Family History  Problem Relation Age of Onset  . Rheum arthritis Mother   . Cancer Mother   . Diabetes Mother   . Hypertension Mother   . Rheum arthritis Father   . Cancer Father   . Diabetes Father   .  Hypertension Father    History  Substance Use Topics  . Smoking status: Current Every Day Smoker -- 0.50 packs/day    Types: Cigarettes  . Smokeless tobacco: Never Used  . Alcohol Use: No   OB History   Grav Para Term Preterm Abortions TAB SAB Ect Mult Living                 Review of Systems  Constitutional: Negative for fever and chills.  Eyes: Negative for visual disturbance.  Respiratory: Negative for cough and shortness of breath.   Cardiovascular: Negative for chest pain, palpitations and leg swelling.  Gastrointestinal: Negative for nausea, vomiting and abdominal pain.  Endocrine: Negative for polydipsia and polyuria.  Genitourinary: Negative for dysuria, urgency and frequency.  Musculoskeletal: Positive for myalgias and arthralgias (see history of present illness).  Skin: Negative for rash.  Neurological: Negative for dizziness, weakness and light-headedness.    Allergies  Cortisone  Home Medications   Current Outpatient Rx  Name  Route  Sig  Dispense  Refill  . divalproex (DEPAKOTE) 500 MG DR tablet   Oral   Take 1 tablet (500 mg total) by mouth every 12 (twelve) hours. For mood control.   60 tablet   0   . ferrous sulfate 325 (65 FE)  MG tablet   Oral   Take 1 tablet (325 mg total) by mouth daily as needed. When energy is low      3   . Fluticasone-Salmeterol (ADVAIR) 100-50 MCG/DOSE AEPB   Inhalation   Inhale 1 puff into the lungs every 12 (twelve) hours.   60 each      . lithium carbonate 600 MG capsule   Oral   Take 1 capsule (600 mg total) by mouth at bedtime. For mood control.   60 capsule   0   . Multiple Vitamin (MULTIVITAMIN WITH MINERALS) TABS tablet   Oral   Take 1 tablet by mouth every morning.         . naproxen (NAPROSYN) 500 MG tablet   Oral   Take 1 tablet (500 mg total) by mouth 2 (two) times daily with a meal.   30 tablet   0   . OLANZapine zydis (ZYPREXA) 10 MG disintegrating tablet   Oral   Take 1 tablet (10 mg total)  by mouth 2 (two) times daily after a meal. For mood control.   60 tablet   0   . QUEtiapine (SEROQUEL XR) 300 MG 24 hr tablet   Oral   Take 300 mg by mouth at bedtime.         . vitamin B-12 (CYANOCOBALAMIN) 1000 MCG tablet   Oral   Take 1 tablet (1,000 mcg total) by mouth daily.          BP 135/90  Pulse 106  Temp(Src) 98.1 F (36.7 C) (Oral)  Resp 16  SpO2 97% Physical Exam  Nursing note and vitals reviewed. Constitutional: She is oriented to person, place, and time. She appears well-developed and well-nourished. She appears distressed.  HENT:  Head: Normocephalic and atraumatic.  Pulmonary/Chest: Effort normal.  Musculoskeletal:  Antalgic gait  Neurological: She is alert and oriented to person, place, and time. Coordination normal.  Skin: Skin is dry. No rash noted. She is not diaphoretic.  Psychiatric: Her mood appears anxious. Her affect is angry and labile. Her speech is rapid and/or pressured and slurred. She is agitated and aggressive.  She is intermittently aggressive, belligerent, and apologetic    ED Course   Procedures (including critical care time)  Labs Reviewed - No data to display No results found. 1. Hip pain, unspecified laterality     MDM  I spoke with the staff in the office of Dr. Magnus Ivan, her orthopedic surgeon, she has an appointment at 9:00 tomorrow morning. She improved with toradol today in the urgent care.  F/u with Dr. Magnus Ivan tomorrow    Meds ordered this encounter  Medications  . ketorolac (TORADOL) injection 60 mg    Sig:      Graylon Good, PA-C 11/30/12 [phone]

## 2012-11-29 NOTE — ED Notes (Signed)
Pt ambulating independently w/ steady gait on d/c in no acute distress, A&Ox4. D/c instructions reviewed w/ pt and family - pt and family deny any further questions or concerns at present. Rx given x1  

## 2012-11-29 NOTE — ED Notes (Signed)
Pt has been eating several snacks from the vending machine since she has been waiting. She keeps asking when she's going back to a room, RN went to speak to patient and she was at the vending machine again.

## 2012-11-29 NOTE — ED Provider Notes (Signed)
CSN: [id number]     Arrival date & time 11/28/12  2335 History     First MD Initiated Contact with Patient 11/29/12 0353     Chief Complaint  Patient presents with  . Headache  . Hip Pain    chronic   (Consider location/radiation/quality/duration/timing/severity/associated sxs/prior Treatment) HPI Comments: 52 year old female with a history of bilateral hip arthritis he was arty being seen by orthopedic surgery, receives periodic steroid injections for this presents 2 weeks after her last injection stating that she has done well while she has been off of her legs but has been walking more the last several days and has had increased pain in her bilateral hips right greater than left. She denies fevers, swelling, redness, nausea or vomiting. She has been taking Percocet until recently as prescribed by her doctor with some improvement. The symptoms are persistent, worse with ambulation and standing, better when she gets off of her feet. This is similar to the chronic pain that she has in her hips  Patient is a 52 y.o. female presenting with headaches and hip pain. The history is provided by the patient.  Headache Hip Pain Associated symptoms include headaches.    Past Medical History  Diagnosis Date  . Bipolar 1 disorder   . Emphysema   . Polysubstance abuse   . Bronchitis   . Emphysema   . Asthma   . Hypertension   . Schizophrenia    Past Surgical History  Procedure Laterality Date  . Abdominal hysterectomy    . Knee surgery      laser to left  . Appendectomy     Family History  Problem Relation Age of Onset  . Rheum arthritis Mother   . Cancer Mother   . Diabetes Mother   . Hypertension Mother   . Rheum arthritis Father   . Cancer Father   . Diabetes Father   . Hypertension Father    History  Substance Use Topics  . Smoking status: Current Every Day Smoker -- 0.50 packs/day    Types: Cigarettes  . Smokeless tobacco: Never Used  . Alcohol Use: No   OB History   Grav Para Term Preterm Abortions TAB SAB Ect Mult Living                 Review of Systems  Neurological: Positive for headaches.  All other systems reviewed and are negative.    Allergies  Cortisone  Home Medications   Current Outpatient Rx  Name  Route  Sig  Dispense  Refill  . divalproex (DEPAKOTE) 500 MG DR tablet   Oral   Take 1 tablet (500 mg total) by mouth every 12 (twelve) hours. For mood control.   60 tablet   0   . ferrous sulfate 325 (65 FE) MG tablet   Oral   Take 1 tablet (325 mg total) by mouth daily as needed. When energy is low      3   . Fluticasone-Salmeterol (ADVAIR) 100-50 MCG/DOSE AEPB   Inhalation   Inhale 1 puff into the lungs every 12 (twelve) hours.   60 each      . lithium carbonate 600 MG capsule   Oral   Take 1 capsule (600 mg total) by mouth at bedtime. For mood control.   60 capsule   0   . Multiple Vitamin (MULTIVITAMIN WITH MINERALS) TABS tablet   Oral   Take 1 tablet by mouth every morning.         Marland Kitchen  OLANZapine zydis (ZYPREXA) 10 MG disintegrating tablet   Oral   Take 1 tablet (10 mg total) by mouth 2 (two) times daily after a meal. For mood control.   60 tablet   0   . QUEtiapine (SEROQUEL XR) 300 MG 24 hr tablet   Oral   Take 300 mg by mouth at bedtime.         . vitamin B-12 (CYANOCOBALAMIN) 1000 MCG tablet   Oral   Take 1 tablet (1,000 mcg total) by mouth daily.         . naproxen (NAPROSYN) 500 MG tablet   Oral   Take 1 tablet (500 mg total) by mouth 2 (two) times daily with a meal.   30 tablet   0    BP 128/68  Pulse 78  Temp(Src) 98.3 F (36.8 C) (Oral)  Resp 20  SpO2 99% Physical Exam  Constitutional: She appears well-developed and well-nourished.  HENT:  Head: Normocephalic and atraumatic.  Eyes: Conjunctivae are normal. Right eye exhibits no discharge. Left eye exhibits no discharge. No scleral icterus.  Cardiovascular: Normal rate.   Pulmonary/Chest: Effort normal.  Musculoskeletal:  Normal range of motion. She exhibits tenderness ( Mild tenderness with flexion of the right hip, she is able and leg with some difficulty but no swelling or redness of the hip). She exhibits no edema.  Skin: Skin is warm and dry. No rash noted. No erythema.    ED Course   Procedures (including critical care time)  Labs Reviewed - No data to display No results found. 1. Osteoarthritis of right hip     MDM  The patient appears clinically stable, normal vital signs, she will be given intramuscular Toradol and discharged home to follow up with her family doctor. I have very clearly told her that I will not treat her chronic symptoms with opiate medications and she is in agreement at this time. Does not appear to be anything more significant than osteoarthritis. This is a chronic complaint, there is no signs of septic arthritis.  Vida Roller, MD 11/29/12 [phone]

## 2012-11-29 NOTE — ED Notes (Signed)
Reports severe pain both hips , and is to have replacement surgery both hips . Was in [facility] ED yesterday, has a non-filled rx from ED at [facility] with her from that visit

## 2012-12-01 NOTE — Progress Notes (Signed)
Patient Discharge Instructions:  After Visit Summary (AVS):   Faxed to:  12/01/12 Discharge Summary Note:   Faxed to:  12/01/12 Psychiatric Admission Assessment Note:   Faxed to:  12/01/12 Suicide Risk Assessment - Discharge Assessment:   Faxed to:  12/01/12 Faxed/Sent to the Next Level Care provider:  12/01/12 Faxed to [facility] @ [phone]  Jerelene Redden, 12/01/2012, 1:11 PM

## 2012-12-04 NOTE — ED Provider Notes (Signed)
Medical screening examination/treatment/procedure(s) were performed by resident physician or non-physician practitioner and as supervising physician I was immediately available for consultation/collaboration.   KINDL,JAMES DOUGLAS MD.   James D Kindl, MD 12/04/12 0949 

## 2012-12-05 ENCOUNTER — Encounter (HOSPITAL_COMMUNITY): Payer: Self-pay | Admitting: *Deleted

## 2012-12-05 ENCOUNTER — Emergency Department (HOSPITAL_COMMUNITY)
Admission: EM | Admit: 2012-12-05 | Discharge: 2012-12-05 | Disposition: A | Discharge status: Home / Self Care | Payer: Medicaid Other | Attending: Emergency Medicine | Admitting: Emergency Medicine

## 2012-12-05 DIAGNOSIS — J45909 Unspecified asthma, uncomplicated: Secondary | ICD-10-CM | POA: Insufficient documentation

## 2012-12-05 DIAGNOSIS — F319 Bipolar disorder, unspecified: Secondary | ICD-10-CM | POA: Insufficient documentation

## 2012-12-05 DIAGNOSIS — M1990 Unspecified osteoarthritis, unspecified site: Secondary | ICD-10-CM | POA: Insufficient documentation

## 2012-12-05 DIAGNOSIS — Z79899 Other long term (current) drug therapy: Secondary | ICD-10-CM | POA: Insufficient documentation

## 2012-12-05 DIAGNOSIS — I10 Essential (primary) hypertension: Secondary | ICD-10-CM | POA: Insufficient documentation

## 2012-12-05 DIAGNOSIS — M25569 Pain in unspecified knee: Secondary | ICD-10-CM | POA: Insufficient documentation

## 2012-12-05 DIAGNOSIS — F209 Schizophrenia, unspecified: Secondary | ICD-10-CM | POA: Insufficient documentation

## 2012-12-05 DIAGNOSIS — IMO0002 Reserved for concepts with insufficient information to code with codable children: Secondary | ICD-10-CM | POA: Insufficient documentation

## 2012-12-05 DIAGNOSIS — F17200 Nicotine dependence, unspecified, uncomplicated: Secondary | ICD-10-CM | POA: Insufficient documentation

## 2012-12-05 DIAGNOSIS — R51 Headache: Secondary | ICD-10-CM | POA: Insufficient documentation

## 2012-12-05 DIAGNOSIS — Z8709 Personal history of other diseases of the respiratory system: Secondary | ICD-10-CM | POA: Insufficient documentation

## 2012-12-05 MED ORDER — KETOROLAC TROMETHAMINE 30 MG/ML IJ SOLN
30.0000 mg | Freq: Once | INTRAMUSCULAR | Status: AC
  Administered 2012-12-05: 30 mg via INTRAMUSCULAR
  Filled 2012-12-05: qty 1

## 2012-12-05 NOTE — ED Provider Notes (Signed)
CSN: [id number]     Arrival date & time 12/05/12  0423 History     First MD Initiated Contact with Patient 12/05/12 0440     Chief Complaint  Patient presents with  . Pain  . Headache   HPI  History provided by the patient and previous medical charts. Patient is a 52 year old female with history of hypertension, bipolar disorder, polysubstance abuse and arthritis who presents with complaints of mild headache and bilateral knee pains. Patient states she actually has multiple aches and pains in her joints but knees are the worst. Had been worsening over the past few days. She does report having some increased standing and walking. She denies any strenuous activity or exercise. She has not taken anything yesterday or today for her symptoms. Patient has been followed in the past by orthopedic specialist and her primary care provider for her symptoms. She has had multiple visits to the emergency room for arthritis complaints. She denies any other changes in symptoms. No other aggravating or alleviating factors. No associated fever, chills or sweats.    Past Medical History  Diagnosis Date  . Bipolar 1 disorder   . Emphysema   . Polysubstance abuse   . Bronchitis   . Emphysema   . Asthma   . Hypertension   . Schizophrenia    Past Surgical History  Procedure Laterality Date  . Abdominal hysterectomy    . Knee surgery      laser to left  . Appendectomy     Family History  Problem Relation Age of Onset  . Rheum arthritis Mother   . Cancer Mother   . Diabetes Mother   . Hypertension Mother   . Rheum arthritis Father   . Cancer Father   . Diabetes Father   . Hypertension Father    History  Substance Use Topics  . Smoking status: Current Every Day Smoker -- 0.50 packs/day    Types: Cigarettes  . Smokeless tobacco: Never Used  . Alcohol Use: No   OB History   Grav Para Term Preterm Abortions TAB SAB Ect Mult Living                 Review of Systems  Constitutional:  Negative for fever and chills.  Respiratory: Negative for cough.   Musculoskeletal: Positive for arthralgias.       Bilateral knee pain  Neurological: Positive for headaches.  All other systems reviewed and are negative.    Allergies  Cortisone and Other  Home Medications   Current Outpatient Rx  Name  Route  Sig  Dispense  Refill  . divalproex (DEPAKOTE) 500 MG DR tablet   Oral   Take 1 tablet (500 mg total) by mouth every 12 (twelve) hours. For mood control.   60 tablet   0   . Fluticasone-Salmeterol (ADVAIR) 100-50 MCG/DOSE AEPB   Inhalation   Inhale 1 puff into the lungs every 12 (twelve) hours.   60 each      . lithium carbonate 600 MG capsule   Oral   Take 1 capsule (600 mg total) by mouth at bedtime. For mood control.   60 capsule   0   . Multiple Vitamin (MULTIVITAMIN WITH MINERALS) TABS tablet   Oral   Take 1 tablet by mouth every morning.         Marland Kitchen OLANZapine zydis (ZYPREXA) 10 MG disintegrating tablet   Oral   Take 1 tablet (10 mg total) by mouth 2 (two) times daily after  a meal. For mood control.   60 tablet   0   . pseudoephedrine-acetaminophen (TYLENOL SINUS) 30-500 MG TABS   Oral   Take 2 tablets by mouth every 4 (four) hours as needed (sinus pain).         . QUEtiapine (SEROQUEL XR) 300 MG 24 hr tablet   Oral   Take 300 mg by mouth at bedtime.         . ferrous sulfate 325 (65 FE) MG tablet   Oral   Take 1 tablet (325 mg total) by mouth daily as needed. When energy is low      3   . naproxen (NAPROSYN) 500 MG tablet   Oral   Take 1 tablet (500 mg total) by mouth 2 (two) times daily with a meal.   30 tablet   0   . vitamin B-12 (CYANOCOBALAMIN) 1000 MCG tablet   Oral   Take 1 tablet (1,000 mcg total) by mouth daily.          BP 107/82  Pulse 114  Temp(Src) 98.4 F (36.9 C) (Oral)  Resp 16  Ht 5\' 9"  (1.753 m)  Wt 185 lb (83.915 kg)  BMI 27.31 kg/m2  SpO2 98% Physical Exam  Nursing note and vitals  reviewed. Constitutional: She is oriented to person, place, and time. She appears well-developed and well-nourished. No distress.  HENT:  Head: Normocephalic and atraumatic.  Eyes: Conjunctivae and EOM are normal. Pupils are equal, round, and reactive to light.  Neck: Normal range of motion. Neck supple.  No meningeal signs  Cardiovascular: Normal rate and regular rhythm.   No murmur heard. Pulmonary/Chest: Effort normal and breath sounds normal. No respiratory distress. She has no wheezes. She has no rales.  Abdominal: Soft. There is no tenderness. There is no rigidity, no rebound, no guarding, no CVA tenderness and no tenderness at McBurney's point.  Musculoskeletal:  No swelling or erythema of bilateral knees. There is normal passive range of motion bilaterally. There is no significant signs of tenderness to firm palpation. No swelling to lower extremities or feet. There is normal pulses and movements.  Neurological: She is alert and oriented to person, place, and time. She has normal strength. No cranial nerve deficit or sensory deficit. Gait normal.  Skin: Skin is warm and dry. No rash noted.  Psychiatric: She has a normal mood and affect. Her behavior is normal.    ED Course   Procedures     1. Arthritis   2. Headache     MDM  5:20 a.m. patient seen and evaluated. Patient sitting in the bed is resting comfortably appears well in no acute distress. Patient initially at triage slightly tachycardic. Heart rate is improved while she is laying and resting. Looking at past visits patient frequently presents slightly tachycardic.  Plan to treat her chronic arthritic pains with intramuscular Toradol. Patient is afebrile and otherwise appears well with no concerns for emergent condition.    Angus Seller, PA-C 12/05/12 [phone]

## 2012-12-05 NOTE — ED Notes (Signed)
Upon entering room to administer pain meds.  Pt snoring and had to be shaken to awaken for pain med shot.

## 2012-12-05 NOTE — ED Notes (Signed)
Pt states she has severe arthritis pain but isn't prescribed anything for it or diagnosed as having arthritis,  Pain in head is greater than 10.  Pt states pain unbearable

## 2012-12-05 NOTE — ED Notes (Signed)
Pt states she is allergic to cortisone but receiving injections

## 2012-12-05 NOTE — ED Provider Notes (Signed)
Medical screening examination/treatment/procedure(s) were performed by non-physician practitioner and as supervising physician I was immediately available for consultation/collaboration.  Amias Hutchinson M Oneita Allmon, MD 12/05/12 0633 

## 2012-12-11 ENCOUNTER — Emergency Department (HOSPITAL_COMMUNITY)
Admission: EM | Admit: 2012-12-11 | Discharge: 2012-12-11 | Disposition: A | Discharge status: Home / Self Care | Payer: Medicaid Other | Attending: Emergency Medicine | Admitting: Emergency Medicine

## 2012-12-11 ENCOUNTER — Encounter (HOSPITAL_COMMUNITY): Payer: Self-pay | Admitting: Emergency Medicine

## 2012-12-11 DIAGNOSIS — J45909 Unspecified asthma, uncomplicated: Secondary | ICD-10-CM | POA: Insufficient documentation

## 2012-12-11 DIAGNOSIS — F319 Bipolar disorder, unspecified: Secondary | ICD-10-CM | POA: Insufficient documentation

## 2012-12-11 DIAGNOSIS — I10 Essential (primary) hypertension: Secondary | ICD-10-CM | POA: Insufficient documentation

## 2012-12-11 DIAGNOSIS — IMO0001 Reserved for inherently not codable concepts without codable children: Secondary | ICD-10-CM | POA: Insufficient documentation

## 2012-12-11 DIAGNOSIS — F17200 Nicotine dependence, unspecified, uncomplicated: Secondary | ICD-10-CM | POA: Insufficient documentation

## 2012-12-11 DIAGNOSIS — J438 Other emphysema: Secondary | ICD-10-CM | POA: Insufficient documentation

## 2012-12-11 DIAGNOSIS — Z765 Malingerer [conscious simulation]: Secondary | ICD-10-CM | POA: Insufficient documentation

## 2012-12-11 DIAGNOSIS — Z79899 Other long term (current) drug therapy: Secondary | ICD-10-CM | POA: Insufficient documentation

## 2012-12-11 DIAGNOSIS — F209 Schizophrenia, unspecified: Secondary | ICD-10-CM | POA: Insufficient documentation

## 2012-12-11 DIAGNOSIS — M2550 Pain in unspecified joint: Secondary | ICD-10-CM | POA: Insufficient documentation

## 2012-12-11 MED ORDER — MELOXICAM 15 MG PO TABS: 15.0000 mg | ORAL_TABLET | Freq: Every day | ORAL | Status: DC

## 2012-12-11 MED ORDER — KETOROLAC TROMETHAMINE 60 MG/2ML IM SOLN
60.0000 mg | Freq: Once | INTRAMUSCULAR | Status: AC
  Administered 2012-12-11: 60 mg via INTRAMUSCULAR
  Filled 2012-12-11: qty 2

## 2012-12-11 NOTE — ED Provider Notes (Signed)
CSN: [id number]     Arrival date & time 12/11/12  1149 History     First MD Initiated Contact with Patient 12/11/12 1154     Chief Complaint  Patient presents with  . Joint Pain   (Consider location/radiation/quality/duration/timing/severity/associated sxs/prior Treatment) HPI Patient is a 52 year old female who presents to emergency department complaining of generalized body and joint aches. Patient states she has had these symptoms for several months. Patient states she's currently seeing her primary care doctor as well as an orthopedic specialist and currently being worked up for a rheumatological problem. Patient states she is waiting to hear back on the blood work. Patient states that she is out of any pain medications. States unable to control her pain at home with ibuprofen and Tylenol. Days pain is in all of her joints all over the body. Patient states joint swell on and off. Denies any fever, chills, and weakness, numbness. Patient states "I would've called my doctor but is Saturday." Past Medical History  Diagnosis Date  . Bipolar 1 disorder   . Emphysema   . Polysubstance abuse   . Bronchitis   . Emphysema   . Asthma   . Hypertension   . Schizophrenia    Past Surgical History  Procedure Laterality Date  . Abdominal hysterectomy    . Knee surgery      laser to left  . Appendectomy     Family History  Problem Relation Age of Onset  . Rheum arthritis Mother   . Cancer Mother   . Diabetes Mother   . Hypertension Mother   . Rheum arthritis Father   . Cancer Father   . Diabetes Father   . Hypertension Father    History  Substance Use Topics  . Smoking status: Current Every Day Smoker -- 0.50 packs/day    Types: Cigarettes  . Smokeless tobacco: Never Used  . Alcohol Use: No   OB History   Grav Para Term Preterm Abortions TAB SAB Ect Mult Living                 Review of Systems  Constitutional: Negative for fever and chills.  HENT: Negative for neck pain  and neck stiffness.   Respiratory: Negative for cough, chest tightness and shortness of breath.   Cardiovascular: Negative for chest pain, palpitations and leg swelling.  Gastrointestinal: Negative for nausea, vomiting, abdominal pain and diarrhea.  Genitourinary: Negative for dysuria, flank pain, vaginal bleeding, vaginal discharge, vaginal pain and pelvic pain.  Musculoskeletal: Positive for myalgias, joint swelling and arthralgias.  Skin: Negative for rash.  Neurological: Negative for dizziness, weakness and headaches.  All other systems reviewed and are negative.    Allergies  Cortisone and Other  Home Medications   Current Outpatient Rx  Name  Route  Sig  Dispense  Refill  . divalproex (DEPAKOTE) 500 MG DR tablet   Oral   Take 1 tablet (500 mg total) by mouth every 12 (twelve) hours. For mood control.   60 tablet   0   . Fluticasone-Salmeterol (ADVAIR) 100-50 MCG/DOSE AEPB   Inhalation   Inhale 1 puff into the lungs every 12 (twelve) hours.   60 each      . lithium carbonate 600 MG capsule   Oral   Take 1 capsule (600 mg total) by mouth at bedtime. For mood control.   60 capsule   0   . Multiple Vitamin (MULTIVITAMIN WITH MINERALS) TABS tablet   Oral   Take 1 tablet  by mouth every morning.         Marland Kitchen OLANZapine zydis (ZYPREXA) 10 MG disintegrating tablet   Oral   Take 10 mg by mouth 2 (two) times daily.         . QUEtiapine (SEROQUEL XR) 300 MG 24 hr tablet   Oral   Take 300 mg by mouth at bedtime.          BP 129/89  Pulse 105  Temp(Src) 98.5 F (36.9 C) (Oral)  Resp 20  SpO2 93% Physical Exam  Nursing note and vitals reviewed. Constitutional: She appears well-developed and well-nourished. No distress.  HENT:  Head: Normocephalic.  Eyes: Conjunctivae are normal.  Neck: Neck supple.  Cardiovascular: Normal rate, regular rhythm and normal heart sounds.   Pulmonary/Chest: Effort normal and breath sounds normal. No respiratory distress. She has  no wheezes. She has no rales.  Abdominal: Soft. Bowel sounds are normal. She exhibits no distension. There is no tenderness. There is no rebound.  Musculoskeletal: She exhibits no edema.  All of the joints appear normal with no swelling, redness. Patient is able to move all extremities and all joints with no difficulties  Neurological: She is alert.  Skin: Skin is warm and dry.  Psychiatric: She has a normal mood and affect. Her behavior is normal.    ED Course   Procedures (including critical care time)  Labs Reviewed - No data to display No results found. 1. Arthralgia     MDM  Patient with frequent visit to emergency department from a flame, as well the drug-seeking behavior. Patient with history of polysubstance abuse and schizophrenia. Currently no active psychosis. I do not think that. Medications are appropriate for this patient at this time. Patient received Toradol shot emergency department for her pain. We'll discharge home on Mobic. He should just to followup on Monday with her doctor. No signs of infection or any other major abnormalities noted on exam. Patient is in no distress.  Filed Vitals:   12/11/12 1158  BP: 129/89  Pulse: 105  Temp: 98.5 F (36.9 C)  TempSrc: Oral  Resp: 20  SpO2: 93%     Shaquala Broeker A Anya Murphey, PA-C 12/11/12 1405

## 2012-12-11 NOTE — ED Provider Notes (Signed)
Medical screening examination/treatment/procedure(s) were performed by non-physician practitioner and as supervising physician I was immediately available for consultation/collaboration.   Dagmar Hait, MD 12/11/12 [phone]

## 2012-12-11 NOTE — ED Notes (Addendum)
Pt coming from home w/ c/o Arthritis pain. Her pain is a 10 out of 10 and occuring "in every joint".  She was diagnosed with Arthritis on July 8th.Pt  States she did not take any medication for her pain today. Pt also states she is awaiting  Lupus test results from her PCP.

## 2012-12-16 ENCOUNTER — Encounter (HOSPITAL_COMMUNITY): Payer: Self-pay | Admitting: Emergency Medicine

## 2012-12-16 ENCOUNTER — Emergency Department (HOSPITAL_COMMUNITY)
Admission: EM | Admit: 2012-12-16 | Discharge: 2012-12-16 | Disposition: A | Discharge status: Home / Self Care | Payer: Medicaid Other | Attending: Emergency Medicine | Admitting: Emergency Medicine

## 2012-12-16 DIAGNOSIS — M25519 Pain in unspecified shoulder: Secondary | ICD-10-CM | POA: Insufficient documentation

## 2012-12-16 DIAGNOSIS — IMO0001 Reserved for inherently not codable concepts without codable children: Secondary | ICD-10-CM | POA: Insufficient documentation

## 2012-12-16 DIAGNOSIS — M2550 Pain in unspecified joint: Secondary | ICD-10-CM

## 2012-12-16 DIAGNOSIS — J45909 Unspecified asthma, uncomplicated: Secondary | ICD-10-CM | POA: Insufficient documentation

## 2012-12-16 DIAGNOSIS — I10 Essential (primary) hypertension: Secondary | ICD-10-CM | POA: Insufficient documentation

## 2012-12-16 DIAGNOSIS — F319 Bipolar disorder, unspecified: Secondary | ICD-10-CM | POA: Insufficient documentation

## 2012-12-16 DIAGNOSIS — G8929 Other chronic pain: Secondary | ICD-10-CM | POA: Insufficient documentation

## 2012-12-16 DIAGNOSIS — M545 Low back pain, unspecified: Secondary | ICD-10-CM | POA: Insufficient documentation

## 2012-12-16 DIAGNOSIS — F209 Schizophrenia, unspecified: Secondary | ICD-10-CM | POA: Insufficient documentation

## 2012-12-16 DIAGNOSIS — Z79899 Other long term (current) drug therapy: Secondary | ICD-10-CM | POA: Insufficient documentation

## 2012-12-16 DIAGNOSIS — M25569 Pain in unspecified knee: Secondary | ICD-10-CM | POA: Insufficient documentation

## 2012-12-16 DIAGNOSIS — Z8709 Personal history of other diseases of the respiratory system: Secondary | ICD-10-CM | POA: Insufficient documentation

## 2012-12-16 DIAGNOSIS — M25529 Pain in unspecified elbow: Secondary | ICD-10-CM | POA: Insufficient documentation

## 2012-12-16 DIAGNOSIS — F17200 Nicotine dependence, unspecified, uncomplicated: Secondary | ICD-10-CM | POA: Insufficient documentation

## 2012-12-16 MED ORDER — TRAMADOL HCL 50 MG PO TABS: 50.0000 mg | ORAL_TABLET | Freq: Four times a day (QID) | ORAL | Status: DC | PRN

## 2012-12-16 MED ORDER — KETOROLAC TROMETHAMINE 60 MG/2ML IM SOLN
60.0000 mg | Freq: Once | INTRAMUSCULAR | Status: AC
  Administered 2012-12-16: 60 mg via INTRAMUSCULAR
  Filled 2012-12-16: qty 2

## 2012-12-16 MED ORDER — DEXAMETHASONE SODIUM PHOSPHATE 10 MG/ML IJ SOLN
10.0000 mg | Freq: Once | INTRAMUSCULAR | Status: AC
  Administered 2012-12-16: 10 mg via INTRAMUSCULAR
  Filled 2012-12-16: qty 1

## 2012-12-16 NOTE — ED Provider Notes (Signed)
CSN: [id number]     Arrival date & time 12/16/12  1512 History  This chart was scribed for Marlon Pel, PA working with Gilda Crease, MD by Quintella Reichert, ED Scribe. This patient was seen in room WTR9/WTR9 and the patient's care was started at 3:59 PM.    Chief Complaint  Patient presents with  . Leg Pain    The history is provided by the patient. No language interpreter was used.    HPI Comments: [name] is a 52 y.o. female with h/o arthritis who presents to the Emergency Department complaining of progressively-worsening generalized cramping pain that has been present for 2 months but has grown severe over the past several days like her normal flair.  This is the patient's 5th visit to the ED in 1 month for the same complaint, pain flair.  Pt localizes pain to her bilateral thighs, bilateral lower legs, lower back,  elbows and shoudlers.  She has had some level of similar pain for 2 months.  She states her PCP informed her that her symptoms are likely due to arthritis and she has an appointment with a rheumatologist on September 11.  She has been taking 300 Seroquel regularly and drinking at least 5 cups of fluid per day, without relief.  One week ago pt was given a steroid injection and a Dilaudid injection at [facility] with significant temporary relief.  She was not given any other pain prescriptions and has not received any steroids since then.  She states has also had relief from Percocet in the past.      Past Medical History  Diagnosis Date  . Bipolar 1 disorder   . Emphysema   . Polysubstance abuse   . Bronchitis   . Emphysema   . Asthma   . Hypertension   . Schizophrenia     Past Surgical History  Procedure Laterality Date  . Abdominal hysterectomy    . Knee surgery      laser to left  . Appendectomy      Family History  Problem Relation Age of Onset  . Rheum arthritis Mother   . Cancer Mother   . Diabetes Mother   .  Hypertension Mother   . Rheum arthritis Father   . Cancer Father   . Diabetes Father   . Hypertension Father     History  Substance Use Topics  . Smoking status: Current Every Day Smoker -- 0.50 packs/day    Types: Cigarettes  . Smokeless tobacco: Never Used  . Alcohol Use: No    OB History   Grav Para Term Preterm Abortions TAB SAB Ect Mult Living                   Review of Systems  Musculoskeletal: Positive for myalgias and arthralgias.  All other systems reviewed and are negative.      Allergies  Cortisone and Other  Home Medications   Current Outpatient Rx  Name  Route  Sig  Dispense  Refill  . divalproex (DEPAKOTE) 500 MG DR tablet   Oral   Take 1 tablet (500 mg total) by mouth every 12 (twelve) hours. For mood control.   60 tablet   0   . Fluticasone-Salmeterol (ADVAIR) 100-50 MCG/DOSE AEPB   Inhalation   Inhale 1 puff into the lungs every 12 (twelve) hours.   60 each      . lithium carbonate 600 MG capsule   Oral  Take 1 capsule (600 mg total) by mouth at bedtime. For mood control.   60 capsule   0   . meloxicam (MOBIC) 15 MG tablet   Oral   Take 1 tablet (15 mg total) by mouth daily.   20 tablet   0   . Multiple Vitamin (MULTIVITAMIN WITH MINERALS) TABS tablet   Oral   Take 1 tablet by mouth every morning.         Marland Kitchen OLANZapine zydis (ZYPREXA) 10 MG disintegrating tablet   Oral   Take 10 mg by mouth 2 (two) times daily.         . Potassium 99 MG TABS   Oral   Take by mouth.         . QUEtiapine (SEROQUEL XR) 300 MG 24 hr tablet   Oral   Take 300 mg by mouth at bedtime.         . vitamin B-12 (CYANOCOBALAMIN) 100 MCG tablet   Oral   Take 50 mcg by mouth daily.         . traMADol (ULTRAM) 50 MG tablet   Oral   Take 1 tablet (50 mg total) by mouth every 6 (six) hours as needed for pain.   15 tablet   0    BP 133/78  Pulse 90  Temp(Src) 97.8 F (36.6 C) (Oral)  Resp 20  SpO2 100%  Physical Exam  Nursing  note and vitals reviewed. Constitutional: She is oriented to person, place, and time. She appears well-developed and well-nourished. No distress.  HENT:  Head: Normocephalic and atraumatic.  Eyes: EOM are normal.  Neck: Neck supple. No tracheal deviation present.  Cardiovascular: Normal rate.   Pulmonary/Chest: Effort normal. No respiratory distress.  Musculoskeletal: Normal range of motion.  Patients jionts show no swelling, redness, ecchymosis or signs of trauma. She has FROM and is able to ambulate.  Neurological: She is alert and oriented to person, place, and time.  Skin: Skin is warm and dry.  Psychiatric: She has a normal mood and affect. Her behavior is normal.    ED Course  Procedures (including critical care time)  DIAGNOSTIC STUDIES: Oxygen Saturation is 100% on room air, normal by my interpretation.    COORDINATION OF CARE: 4:04 PM-Discussed treatment plan which includes pain medication and steroid injection with pt at bedside and pt agreed to plan.    Labs Review Labs Reviewed - No data to display  Imaging Review No results found.  MDM   1. Arthralgia    Pt with multiple visits for chronic pain problems. No new symptoms. Endorsed that we do not given narcotic pain medication for chronic pain. She did not argue.  Given Toradol injection and Decadron injection in the ED  Rx for Mobic  52 y.o.Taija Mathias Cardell's evaluation in the Emergency Department is complete. It has been determined that no acute conditions requiring further emergency intervention are present at this time. The patient/guardian have been advised of the diagnosis and plan. We have discussed signs and symptoms that warrant return to the ED, such as changes or worsening in symptoms.  Vital signs are stable at discharge. Filed Vitals:   12/16/12 1531  BP: 133/78  Pulse: 90  Temp: 97.8 F (36.6 C)  Resp: 20    Patient/guardian has voiced understanding and agreed to follow-up with the PCP or  specialist.  I personally performed the services described in this documentation, which was scribed in my presence. The recorded information has been reviewed  and is accurate.    Dorthula Matas, PA-C 12/16/12 [phone]

## 2012-12-16 NOTE — ED Notes (Signed)
Pt was seen on the 23 for pain in legs and has the same today. Has a n appointment with RA doc on sept 11.

## 2012-12-17 NOTE — ED Provider Notes (Signed)
Medical screening examination/treatment/procedure(s) were performed by non-physician practitioner and as supervising physician I was immediately available for consultation/collaboration.    Skarlett Sedlacek J. Doria Fern, MD 12/17/12 2100 

## 2013-07-20 ENCOUNTER — Encounter (HOSPITAL_COMMUNITY): Payer: Self-pay | Admitting: Emergency Medicine

## 2013-07-20 ENCOUNTER — Emergency Department (HOSPITAL_COMMUNITY)
Admission: EM | Admit: 2013-07-20 | Discharge: 2013-07-20 | Disposition: A | Discharge status: Home / Self Care | Payer: Medicaid Other | Attending: Emergency Medicine | Admitting: Emergency Medicine

## 2013-07-20 DIAGNOSIS — F319 Bipolar disorder, unspecified: Secondary | ICD-10-CM | POA: Insufficient documentation

## 2013-07-20 DIAGNOSIS — M1990 Unspecified osteoarthritis, unspecified site: Secondary | ICD-10-CM

## 2013-07-20 DIAGNOSIS — Z79899 Other long term (current) drug therapy: Secondary | ICD-10-CM | POA: Insufficient documentation

## 2013-07-20 DIAGNOSIS — Z87828 Personal history of other (healed) physical injury and trauma: Secondary | ICD-10-CM | POA: Insufficient documentation

## 2013-07-20 DIAGNOSIS — M545 Low back pain, unspecified: Secondary | ICD-10-CM | POA: Insufficient documentation

## 2013-07-20 DIAGNOSIS — IMO0002 Reserved for concepts with insufficient information to code with codable children: Secondary | ICD-10-CM | POA: Insufficient documentation

## 2013-07-20 DIAGNOSIS — F17200 Nicotine dependence, unspecified, uncomplicated: Secondary | ICD-10-CM | POA: Insufficient documentation

## 2013-07-20 DIAGNOSIS — Z791 Long term (current) use of non-steroidal anti-inflammatories (NSAID): Secondary | ICD-10-CM | POA: Insufficient documentation

## 2013-07-20 DIAGNOSIS — J45909 Unspecified asthma, uncomplicated: Secondary | ICD-10-CM | POA: Insufficient documentation

## 2013-07-20 DIAGNOSIS — K5900 Constipation, unspecified: Secondary | ICD-10-CM | POA: Insufficient documentation

## 2013-07-20 DIAGNOSIS — M546 Pain in thoracic spine: Secondary | ICD-10-CM | POA: Insufficient documentation

## 2013-07-20 DIAGNOSIS — F209 Schizophrenia, unspecified: Secondary | ICD-10-CM | POA: Insufficient documentation

## 2013-07-20 DIAGNOSIS — M129 Arthropathy, unspecified: Secondary | ICD-10-CM | POA: Insufficient documentation

## 2013-07-20 DIAGNOSIS — I10 Essential (primary) hypertension: Secondary | ICD-10-CM | POA: Insufficient documentation

## 2013-07-20 DIAGNOSIS — J438 Other emphysema: Secondary | ICD-10-CM | POA: Insufficient documentation

## 2013-07-20 MED ORDER — METHOCARBAMOL 500 MG PO TABS: 500.0000 mg | ORAL_TABLET | Freq: Two times a day (BID) | ORAL | Status: DC

## 2013-07-20 MED ORDER — TRAMADOL HCL 50 MG PO TABS: 50.0000 mg | ORAL_TABLET | Freq: Four times a day (QID) | ORAL | Status: DC | PRN

## 2013-07-20 NOTE — ED Notes (Signed)
Pt in c/o back pain, states she thinks she has a pinched nerve in her lower back, states she thinks it is from stress, worse with movement, history of same, pain since this morning

## 2013-07-20 NOTE — ED Notes (Signed)
Pt. Stated, i need to tlak to the Dr. About my Bi-Polar med.

## 2013-07-20 NOTE — Discharge Instructions (Signed)
Back Pain, Adult  Back pain is very common. The pain often gets better over time. The cause of back pain is usually not dangerous. Most people can learn to manage their back pain on their own.   HOME CARE   · Stay active. Start with short walks on flat ground if you can. Try to walk farther each day.  · Do not sit, drive, or stand in one place for more than 30 minutes. Do not stay in bed.  · Do not avoid exercise or work. Activity can help your back heal faster.  · Be careful when you bend or lift an object. Bend at your knees, keep the object close to you, and do not twist.  · Sleep on a firm mattress. Lie on your side, and bend your knees. If you lie on your back, put a pillow under your knees.  · Only take medicines as told by your doctor.  · Put ice on the injured area.  · Put ice in a plastic bag.  · Place a towel between your skin and the bag.  · Leave the ice on for 15-20 minutes, 03-04 times a day for the first 2 to 3 days. After that, you can switch between ice and heat packs.  · Ask your doctor about back exercises or massage.  · Avoid feeling anxious or stressed. Find good ways to deal with stress, such as exercise.  GET HELP RIGHT AWAY IF:   · Your pain does not go away with rest or medicine.  · Your pain does not go away in 1 week.  · You have new problems.  · You do not feel well.  · The pain spreads into your legs.  · You cannot control when you poop (bowel movement) or pee (urinate).  · Your arms or legs feel weak or lose feeling (numbness).  · You feel sick to your stomach (nauseous) or throw up (vomit).  · You have belly (abdominal) pain.  · You feel like you may pass out (faint).  MAKE SURE YOU:   · Understand these instructions.  · Will watch your condition.  · Will get help right away if you are not doing well or get worse.  Document Released: 09/24/2007 Document Revised: 06/30/2011 Document Reviewed: 08/26/2010  Exit[employer]® Patient Information ©2014 [employer].

## 2013-07-20 NOTE — ED Provider Notes (Signed)
CSN: [id number]     Arrival date & time 07/20/13  1503 History  This chart was scribed for non-physician practitioner, Fayrene Helper, PA-C working with Harlon Ditty, MD by Luisa Dago, ED scribe. This patient was seen in room TR11C/TR11C and the patient's care was started at 4:41 PM.    Chief Complaint  Patient presents with  . Back Pain   The history is provided by the patient. No language interpreter was used.   HPI Comments: [name] is a 53 y.o. female who presents to the Emergency Department complaining of worsening lower back pain that started 3 days ago. Pt states that the pain is exacerbated by movement and sitting. She describes the pain as "taking her breath away" and worse during the day. She states that she has been under a lot of stress due to illness in her family. Pt thinks that the stressed has caused her to feel drained physically and emotionally. She is concerned that she may have a pinched nerve in her back. Pt states that she has experienced similar pain before, but her previous episode was due to an injury during exercise 20 years ago. She is also complaining of associated constipation. Pt denies taking any OTC medication to alleviate her symptoms. She denies any recent injury.Pt states that she had a hysterectomy and two weeks ago she experienced some hematuria. No history of cancer or IV drug use. Denies any fever, chills, diaphoresis, rash, frequency, weakness, numbness, or congestion.   Past Medical History  Diagnosis Date  . Bipolar 1 disorder   . Emphysema   . Polysubstance abuse   . Bronchitis   . Emphysema   . Asthma   . Hypertension   . Schizophrenia    Past Surgical History  Procedure Laterality Date  . Abdominal hysterectomy    . Knee surgery      laser to left  . Appendectomy     Family History  Problem Relation Age of Onset  . Rheum arthritis Mother   . Cancer Mother   . Diabetes Mother   . Hypertension Mother   . Rheum arthritis Father    . Cancer Father   . Diabetes Father   . Hypertension Father    History  Substance Use Topics  . Smoking status: Current Every Day Smoker -- 0.50 packs/day    Types: Cigarettes  . Smokeless tobacco: Never Used  . Alcohol Use: No   OB History   Grav Para Term Preterm Abortions TAB SAB Ect Mult Living                 Review of Systems  Constitutional: Negative for fever, chills and diaphoresis.  HENT: Negative for congestion.   Gastrointestinal: Positive for constipation. Negative for nausea, vomiting and abdominal pain.  Genitourinary: Negative for dysuria and frequency.  Musculoskeletal: Positive for back pain.      Allergies  Cortisone and Other  Home Medications   Current Outpatient Rx  Name  Route  Sig  Dispense  Refill  . divalproex (DEPAKOTE) 500 MG DR tablet   Oral   Take 1 tablet (500 mg total) by mouth every 12 (twelve) hours. For mood control.   60 tablet   0   . Fluticasone-Salmeterol (ADVAIR) 100-50 MCG/DOSE AEPB   Inhalation   Inhale 1 puff into the lungs every 12 (twelve) hours.   60 each      . lithium carbonate 600 MG capsule   Oral   Take 1 capsule (600  mg total) by mouth at bedtime. For mood control.   60 capsule   0   . meloxicam (MOBIC) 15 MG tablet   Oral   Take 1 tablet (15 mg total) by mouth daily.   20 tablet   0   . Multiple Vitamin (MULTIVITAMIN WITH MINERALS) TABS tablet   Oral   Take 1 tablet by mouth every morning.         Marland Kitchen. OLANZapine zydis (ZYPREXA) 10 MG disintegrating tablet   Oral   Take 10 mg by mouth 2 (two) times daily.         . Potassium 99 MG TABS   Oral   Take by mouth.         . QUEtiapine (SEROQUEL XR) 300 MG 24 hr tablet   Oral   Take 300 mg by mouth at bedtime.         . traMADol (ULTRAM) 50 MG tablet   Oral   Take 1 tablet (50 mg total) by mouth every 6 (six) hours as needed for pain.   15 tablet   0   . vitamin B-12 (CYANOCOBALAMIN) 100 MCG tablet   Oral   Take 50 mcg by mouth  daily.          Triage Vitals: BP 133/96  Pulse 119  Temp(Src) 98.4 F (36.9 C) (Oral)  Resp 24  Ht 5\' 9"  (1.753 m)  Wt 160 lb (72.576 kg)  BMI 23.62 kg/m2  SpO2 96%  Physical Exam  Nursing note and vitals reviewed. Constitutional: She appears well-developed and well-nourished. No distress.  HENT:  Head: Normocephalic and atraumatic.  Eyes: Conjunctivae are normal. Right eye exhibits no discharge. Left eye exhibits no discharge.  Neck: Neck supple.  Cardiovascular: Normal rate, regular rhythm and normal heart sounds.  Exam reveals no gallop and no friction rub.   No murmur heard. Pulmonary/Chest: Effort normal and breath sounds normal. No respiratory distress.  Abdominal: Soft. She exhibits no distension. There is no tenderness. There is no CVA tenderness.  Musculoskeletal: She exhibits tenderness. She exhibits no edema.  Tenderness thoracic and lumbar region with no significant midline crepitus and step offs. Able to ambulate with some significant difficulty due to pain. No CVA tenderness. Negative straight leg raises.  Neurological: She is alert.  Skin: Skin is warm and dry.  Psychiatric: She has a normal mood and affect. Her behavior is normal. Thought content normal.    ED Course  Procedures (including critical care time)  DIAGNOSTIC STUDIES: Oxygen Saturation is 96% on RA, low by my interpretation.    COORDINATION OF CARE: 4:56 PM- Will prescribed muscle relaxer and antiinflammatory medication. Pt advised of plan for treatment and pt agrees.  Labs Review Labs Reviewed - No data to display Imaging Review No results found.   EKG Interpretation None      MDM   Final diagnoses:  Low back pain  Arthritis    BP 147/97  Pulse 101  Temp(Src) 98.4 F (36.9 C) (Oral)  Resp 20  Ht 5\' 9"  (1.753 m)  Wt 160 lb (72.576 kg)  BMI 23.62 kg/m2  SpO2 98%   I personally performed the services described in this documentation, which was scribed in my presence. The  recorded information has been reviewed and is accurate.     Fayrene HelperBowie Ladye Macnaughton, PA-C 07/20/13 1708

## 2013-07-22 NOTE — ED Provider Notes (Signed)
Medical screening examination/treatment/procedure(s) were performed by non-physician practitioner and as supervising physician I was immediately available for consultation/collaboration.   Candyce ChurnJohn David Catherin Doorn III, MD 07/22/13 [phone]

## 2013-07-25 ENCOUNTER — Emergency Department (HOSPITAL_COMMUNITY): Payer: Medicaid Other

## 2013-07-25 ENCOUNTER — Encounter (HOSPITAL_COMMUNITY): Payer: Self-pay | Admitting: Emergency Medicine

## 2013-07-25 ENCOUNTER — Emergency Department (HOSPITAL_COMMUNITY)
Admission: EM | Admit: 2013-07-25 | Discharge: 2013-07-25 | Discharge status: Against Medical Advice | Payer: Medicaid Other | Source: Home / Self Care | Attending: Emergency Medicine | Admitting: Emergency Medicine

## 2013-07-25 ENCOUNTER — Emergency Department (HOSPITAL_COMMUNITY)
Admission: EM | Admit: 2013-07-25 | Discharge: 2013-07-25 | Disposition: A | Discharge status: Home / Self Care | Payer: Medicaid Other | Attending: Emergency Medicine | Admitting: Emergency Medicine

## 2013-07-25 DIAGNOSIS — F17200 Nicotine dependence, unspecified, uncomplicated: Secondary | ICD-10-CM

## 2013-07-25 DIAGNOSIS — M545 Low back pain, unspecified: Secondary | ICD-10-CM

## 2013-07-25 DIAGNOSIS — Z8709 Personal history of other diseases of the respiratory system: Secondary | ICD-10-CM | POA: Insufficient documentation

## 2013-07-25 DIAGNOSIS — J45909 Unspecified asthma, uncomplicated: Secondary | ICD-10-CM

## 2013-07-25 DIAGNOSIS — R110 Nausea: Secondary | ICD-10-CM | POA: Insufficient documentation

## 2013-07-25 DIAGNOSIS — R059 Cough, unspecified: Secondary | ICD-10-CM | POA: Insufficient documentation

## 2013-07-25 DIAGNOSIS — J438 Other emphysema: Secondary | ICD-10-CM

## 2013-07-25 DIAGNOSIS — I10 Essential (primary) hypertension: Secondary | ICD-10-CM

## 2013-07-25 DIAGNOSIS — Z79899 Other long term (current) drug therapy: Secondary | ICD-10-CM | POA: Insufficient documentation

## 2013-07-25 DIAGNOSIS — R000 Tachycardia, unspecified: Secondary | ICD-10-CM | POA: Insufficient documentation

## 2013-07-25 DIAGNOSIS — R05 Cough: Secondary | ICD-10-CM

## 2013-07-25 DIAGNOSIS — F209 Schizophrenia, unspecified: Secondary | ICD-10-CM | POA: Insufficient documentation

## 2013-07-25 DIAGNOSIS — IMO0002 Reserved for concepts with insufficient information to code with codable children: Secondary | ICD-10-CM | POA: Insufficient documentation

## 2013-07-25 DIAGNOSIS — F319 Bipolar disorder, unspecified: Secondary | ICD-10-CM | POA: Insufficient documentation

## 2013-07-25 DIAGNOSIS — R52 Pain, unspecified: Secondary | ICD-10-CM | POA: Insufficient documentation

## 2013-07-25 DIAGNOSIS — M549 Dorsalgia, unspecified: Secondary | ICD-10-CM

## 2013-07-25 MED ORDER — LIDOCAINE 5 % EX PTCH: 1.0000 | MEDICATED_PATCH | CUTANEOUS | Status: DC

## 2013-07-25 MED ORDER — OXYCODONE-ACETAMINOPHEN 5-325 MG PO TABS
1.0000 | ORAL_TABLET | Freq: Once | ORAL | Status: DC
  Filled 2013-07-25: qty 2

## 2013-07-25 MED ORDER — ACETAMINOPHEN 325 MG PO TABS: 650.0000 mg | ORAL_TABLET | Freq: Four times a day (QID) | ORAL | Status: DC | PRN

## 2013-07-25 MED ORDER — DIAZEPAM 5 MG PO TABS
5.0000 mg | ORAL_TABLET | Freq: Once | ORAL | Status: AC
  Administered 2013-07-25: 5 mg via ORAL
  Filled 2013-07-25: qty 1

## 2013-07-25 MED ORDER — ALBUTEROL SULFATE HFA 108 (90 BASE) MCG/ACT IN AERS
2.0000 | INHALATION_SPRAY | Freq: Once | RESPIRATORY_TRACT | Status: DC
  Filled 2013-07-25: qty 6.7

## 2013-07-25 MED ORDER — ONDANSETRON 4 MG PO TBDP
4.0000 mg | ORAL_TABLET | Freq: Once | ORAL | Status: AC
  Administered 2013-07-25: 4 mg via ORAL
  Filled 2013-07-25: qty 1

## 2013-07-25 MED ORDER — DIAZEPAM 5 MG PO TABS: 5.0000 mg | ORAL_TABLET | Freq: Two times a day (BID) | ORAL | Status: DC

## 2013-07-25 NOTE — ED Notes (Signed)
Patient brought in by ptar.  She has pain all over.  She also has cough x 2 weeks.  Patient would not provide much information to ptar.

## 2013-07-25 NOTE — ED Notes (Signed)
Pt yelling and becoming angry with staff in fast track.

## 2013-07-25 NOTE — ED Provider Notes (Signed)
2:01 PM Went to assess pt. Extremely difficult to ascertain why she is here as she refuses to answer this question and many other simple ones. Yelling loudly for no apparent reason. Any attempts to ask her questions or otherwise interact with are responded with, "It's in my record. It's all in my record." "My family can tell you everything you need to know. I'm not talking until they get here."  [facility] Explained that I cannot transfer her without out evaluation first. She would rather leave and go herself. She was ambulating under her own power. Aside from being loud, she is in NAD. Vitals noted. Afebrile. Mild tachycardia. Normotensive.   Raeford RazorStephen Virgal Warmuth, MD 07/25/13 1407

## 2013-07-25 NOTE — ED Notes (Addendum)
This RN to triage to bring pt back to room- pt unhappy in triage, cursing at RN on the way back to a room.  RN offered apology, pain medication, and to call family member- pt would not answer questions or follow instructions, remains uncooperative.  MD at bedside to encourage pt to be evaluated, pt continued to refuse to be assessed or answer any further questions, taken in wheelchair by security to lobby per patient request.  Charge RN aware of situation.

## 2013-07-25 NOTE — Discharge Instructions (Signed)
Your x-ray was negative for any acute bony reasons for your back pain today. Your pain is likely continued due to muscle spasms. Please discontinue the Robaxin and take the Valium and Tylenol as prescribed. Please use lidoderm patches as prescribed. Please do not drive on these medications as they may impair your judgement. Please follow up with your primary care physician in 1-2 days. If you do not have one please call the [facility] and wellness Center number listed above. Please read all discharge instructions and return precautions.   Back Pain, Adult Low back pain is very common. About 1 in 5 people have back pain.The cause of low back pain is rarely dangerous. The pain often gets better over time.About half of people with a sudden onset of back pain feel better in just 2 weeks. About 8 in 10 people feel better by 6 weeks.  CAUSES Some common causes of back pain include:  Strain of the muscles or ligaments supporting the spine.  Wear and tear (degeneration) of the spinal discs.  Arthritis.  Direct injury to the back. DIAGNOSIS Most of the time, the direct cause of low back pain is not known.However, back pain can be treated effectively even when the exact cause of the pain is unknown.Answering your caregiver's questions about your overall health and symptoms is one of the most accurate ways to make sure the cause of your pain is not dangerous. If your caregiver needs more information, he or she may order lab work or imaging tests (X-rays or MRIs).However, even if imaging tests show changes in your back, this usually does not require surgery. HOME CARE INSTRUCTIONS For many people, back pain returns.Since low back pain is rarely dangerous, it is often a condition that people can learn to [facility] their own.   Remain active. It is stressful on the back to sit or stand in one place. Do not sit, drive, or stand in one place for more than 30 minutes at a time. Take short walks on level  surfaces as soon as pain allows.Try to increase the length of time you walk each day.  Do not stay in bed.Resting more than 1 or 2 days can delay your recovery.  Do not avoid exercise or work.Your body is made to move.It is not dangerous to be active, even though your back may hurt.Your back will likely heal faster if you return to being active before your pain is gone.  Pay attention to your body when you bend and lift. Many people have less discomfortwhen lifting if they bend their knees, keep the load close to their bodies,and avoid twisting. Often, the most comfortable positions are those that put less stress on your recovering back.  Find a comfortable position to sleep. Use a firm mattress and lie on your side with your knees slightly bent. If you lie on your back, put a pillow under your knees.  Only take over-the-counter or prescription medicines as directed by your caregiver. Over-the-counter medicines to reduce pain and inflammation are often the most helpful.Your caregiver may prescribe muscle relaxant drugs.These medicines help dull your pain so you can more quickly return to your normal activities and healthy exercise.  Put ice on the injured area.  Put ice in a plastic bag.  Place a towel between your skin and the bag.  Leave the ice on for 15-20 minutes, 03-04 times a day for the first 2 to 3 days. After that, ice and heat may be alternated to reduce pain and  spasms.  Ask your caregiver about trying back exercises and gentle massage. This may be of some benefit.  Avoid feeling anxious or stressed.Stress increases muscle tension and can worsen back pain.It is important to recognize when you are anxious or stressed and learn ways to manage it.Exercise is a great option. SEEK MEDICAL CARE IF:  You have pain that is not relieved with rest or medicine.  You have pain that does not improve in 1 week.  You have new symptoms.  You are generally not feeling  well. SEEK IMMEDIATE MEDICAL CARE IF:   You have pain that radiates from your back into your legs.  You develop new bowel or bladder control problems.  You have unusual weakness or numbness in your arms or legs.  You develop nausea or vomiting.  You develop abdominal pain.  You feel faint. Document Released: 04/07/2005 Document Revised: 10/07/2011 Document Reviewed: 08/26/2010 [facility] Patient Information 2014 [place], [place].

## 2013-07-25 NOTE — ED Provider Notes (Signed)
CSN: [id number]     Arrival date & time 07/25/13  1631 History   First MD Initiated Contact with Patient 07/25/13 1727     Chief Complaint  Patient presents with  . Back Pain  . Knee Pain     (Consider location/radiation/quality/duration/timing/severity/associated sxs/prior Treatment) HPI Comments: This is a 53 year old female past medical history significant for bipolar 1 disorder, polysubstance abuse, schizophrenia, hypertension, asthma presented to the emergency department for continued lower back pain that started 8 days ago. Patient describes her pain as severe tightness with radiation down legs. She was in the emergency department on April 1 and given muscle relaxants and pain medication, she states these are not working. She denies any new injuries. Denies any fevers, chills, nausea, vomiting, numbness or weakness, bladder or bowel incontinence, history of cancer, history of IV drug use, night sweats.  Patient is a 53 y.o. female presenting with back pain and knee pain.  Back Pain Associated symptoms: no fever   Knee Pain Associated symptoms: back pain   Associated symptoms: no fever     Past Medical History  Diagnosis Date  . Bipolar 1 disorder   . Emphysema   . Polysubstance abuse   . Bronchitis   . Emphysema   . Asthma   . Hypertension   . Schizophrenia    Past Surgical History  Procedure Laterality Date  . Abdominal hysterectomy    . Knee surgery      laser to left  . Appendectomy     Family History  Problem Relation Age of Onset  . Rheum arthritis Mother   . Cancer Mother   . Diabetes Mother   . Hypertension Mother   . Rheum arthritis Father   . Cancer Father   . Diabetes Father   . Hypertension Father    History  Substance Use Topics  . Smoking status: Current Every Day Smoker -- 0.50 packs/day    Types: Cigarettes  . Smokeless tobacco: Never Used  . Alcohol Use: No   OB History   Grav Para Term Preterm Abortions TAB SAB Ect Mult Living         Review of Systems  Constitutional: Negative for fever and chills.  Musculoskeletal: Positive for back pain.  All other systems reviewed and are negative.      Allergies  Cortisone and Other  Home Medications   Current Outpatient Rx  Name  Route  Sig  Dispense  Refill  . divalproex (DEPAKOTE) 500 MG DR tablet   Oral   Take 1 tablet (500 mg total) by mouth every 12 (twelve) hours. For mood control.   60 tablet   0   . Fluticasone-Salmeterol (ADVAIR) 100-50 MCG/DOSE AEPB   Inhalation   Inhale 1 puff into the lungs every 12 (twelve) hours.   60 each      . lithium carbonate 600 MG capsule   Oral   Take 1 capsule (600 mg total) by mouth at bedtime. For mood control.   60 capsule   0   . meloxicam (MOBIC) 15 MG tablet   Oral   Take 1 tablet (15 mg total) by mouth daily.   20 tablet   0   . methocarbamol (ROBAXIN) 500 MG tablet   Oral   Take 1 tablet (500 mg total) by mouth 2 (two) times daily.   20 tablet   0   . Multiple Vitamin (MULTIVITAMIN WITH MINERALS) TABS tablet   Oral   Take 1 tablet by mouth every  morning.         Marland Kitchen OLANZapine zydis (ZYPREXA) 10 MG disintegrating tablet   Oral   Take 10 mg by mouth 2 (two) times daily.         . Potassium 99 MG TABS   Oral   Take by mouth.         . QUEtiapine (SEROQUEL XR) 300 MG 24 hr tablet   Oral   Take 300 mg by mouth at bedtime.         . vitamin B-12 (CYANOCOBALAMIN) 100 MCG tablet   Oral   Take 50 mcg by mouth daily.         Marland Kitchen acetaminophen (TYLENOL) 325 MG tablet   Oral   Take 2 tablets (650 mg total) by mouth every 6 (six) hours as needed.   21 tablet   0   . diazepam (VALIUM) 5 MG tablet   Oral   Take 1 tablet (5 mg total) by mouth 2 (two) times daily.   10 tablet   0   . lidocaine (LIDODERM) 5 %   Transdermal   Place 1 patch onto the skin daily. Remove & Discard patch within 12 hours or as directed by MD   6 patch   0    BP 111/87  Pulse 102  Temp(Src) 99 F  (37.2 C) (Oral)  Resp 22  SpO2 96% Physical Exam  Nursing note and vitals reviewed. Constitutional: She is oriented to person, place, and time. She appears well-developed and well-nourished. No distress.  HENT:  Head: Normocephalic and atraumatic.  Right Ear: External ear normal.  Left Ear: External ear normal.  Nose: Nose normal.  Mouth/Throat: Oropharynx is clear and moist. No oropharyngeal exudate.  Eyes: Conjunctivae and EOM are normal. Pupils are equal, round, and reactive to light.  Neck: Normal range of motion. Neck supple.  Cardiovascular: Regular rhythm, normal heart sounds and intact distal pulses.  Tachycardia present.   Pulmonary/Chest: Effort normal and breath sounds normal. No respiratory distress.  Abdominal: Soft. There is no tenderness.  Neurological: She is alert and oriented to person, place, and time. She has normal strength. No cranial nerve deficit. Gait normal. GCS eye subscore is 4. GCS verbal subscore is 5. GCS motor subscore is 6.  Sensation grossly intact.  No pronator drift.  Bilateral heel-knee-shin intact.  Skin: Skin is warm and dry. She is not diaphoretic.  Psychiatric: Her speech is rapid and/or pressured. She is agitated.    ED Course  Procedures (including critical care time) Medications  albuterol (PROVENTIL HFA;VENTOLIN HFA) 108 (90 BASE) MCG/ACT inhaler 2 puff (not administered)  diazepam (VALIUM) tablet 5 mg (5 mg Oral Given 07/25/13 1801)    Labs Review Labs Reviewed - No data to display Imaging Review Dg Lumbar Spine Complete  07/25/2013   CLINICAL DATA:  Back pain, no known injury  EXAM: LUMBAR SPINE - COMPLETE 4+ VIEW  COMPARISON:  CT abdomen and pelvis 04/04/2012  FINDINGS: Five non-rib-bearing lumbar type vertebrae.  Question osseous demineralization.  Minimal levoconvex scoliosis apex L3.  Slight anterior wedging of L1 is old, potentially developmental, unchanged since 2013.  Vertebral body and disc space heights otherwise maintained.   No acute fracture, subluxation, or bone destruction.  Minimal chronic superior endplate irregularity at L4 is stable since 2013.  No spondylolysis.  SI joints symmetric.  IMPRESSION: No acute lumbar spine abnormalities.   Electronically Signed   By: Ulyses Southward M.D.   On: 07/25/2013 18:29  EKG Interpretation None      MDM   Final diagnoses:  Back pain, lumbosacral    Filed Vitals:   07/25/13 1741  BP: 111/87  Pulse: 102  Temp: 99 F (37.2 C)  Resp: 22    Afebrile, NAD, non-toxic appearing, AAOx4. She is mildly tachycardic, appears that patient is mildly tachycardic at several previous ER visits.  Patient with back pain.  No neurological deficits and normal neuro exam.  Patient can walk but states is painful.  No loss of bowel or bladder control.  No concern for cauda equina.  No fever, night sweats, weight loss, h/o cancer, IVDU.  Patient returning for continued back pain. [facility] and verbally abusive towards staff and left prior to evaluation. No acute findings at this time. X-ray obtained without acute abnormality. Will change muscle relaxant for patient and advised Tylenol. Do not feel comfortable prescribing narcotic pain medications for this patient. Advised patient follow up with her primary care doctor regarding her continued back pain. Return precautions discussed. Patient agreeable to plan. Patient is stable at time of discharge   Jeannetta EllisJennifer L Johnryan Sao, PA-C 07/25/13 2020

## 2013-07-25 NOTE — ED Notes (Signed)
Patient reports she is hurting all over.  She has all the "sx associated with that, my breathing, my heart is fluttering all over the place."  She is moaning in triage.

## 2013-07-25 NOTE — ED Notes (Signed)
Patient is upset due to pain and nausea.  Advised that we administered zofran.  Pain medication in hand to administer.  Patient had went outside.  Patient encouraged to return inside.  She continued to be upset.  Wanted to go to [facility].  Again, encouraged to return, we now have a room ready for her.  Patient continues to be upset.

## 2013-07-25 NOTE — ED Notes (Signed)
Pt was yelling and angry at staff. Charge rn told pt she had to be cooperative or she would have to leave. Pt wanted her sisters phone number. Pt given the paper with the phone number. Pt calmer now

## 2013-07-25 NOTE — ED Notes (Signed)
Received call from E RN, patient is leaving.

## 2013-07-25 NOTE — ED Notes (Signed)
Pt states she has a pinched nerve and has pain "all over". Pt states she pain in her R knee with hx of arthritis. [facility]. Pt arrives in wheelchair. Pt has no acute distress.

## 2013-07-26 IMAGING — CR DG RIBS W/ CHEST 3+V*R*
3 series · 3 of 3 positions shown · non-contrast
Comparison: 05/24/2011

CLINICAL DATA: No injury.  Right posterior rib pain

RIGHT RIBS AND CHEST - 3+ VIEW

[w chest pa (1 of 3)]
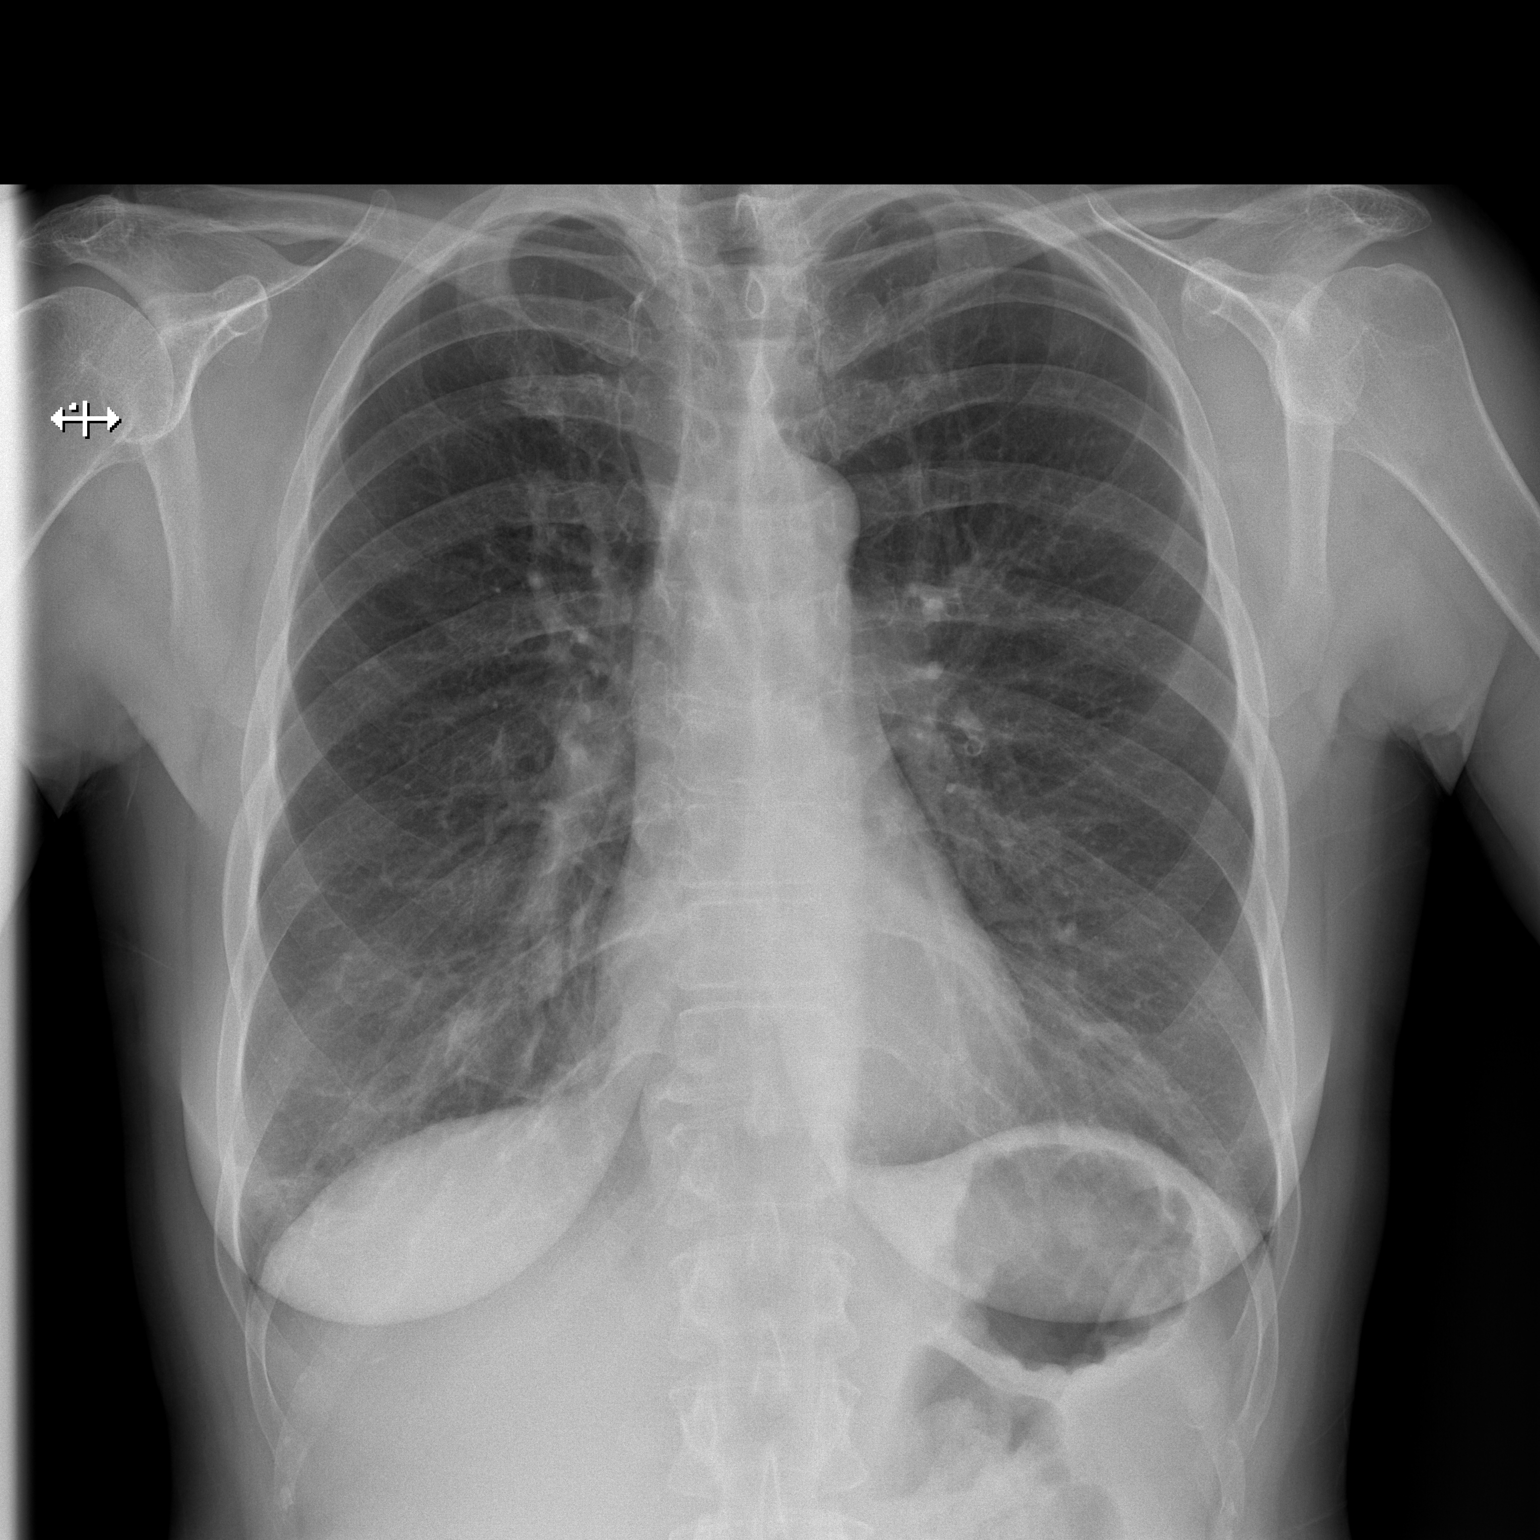

[w chest pa (2 of 3)]
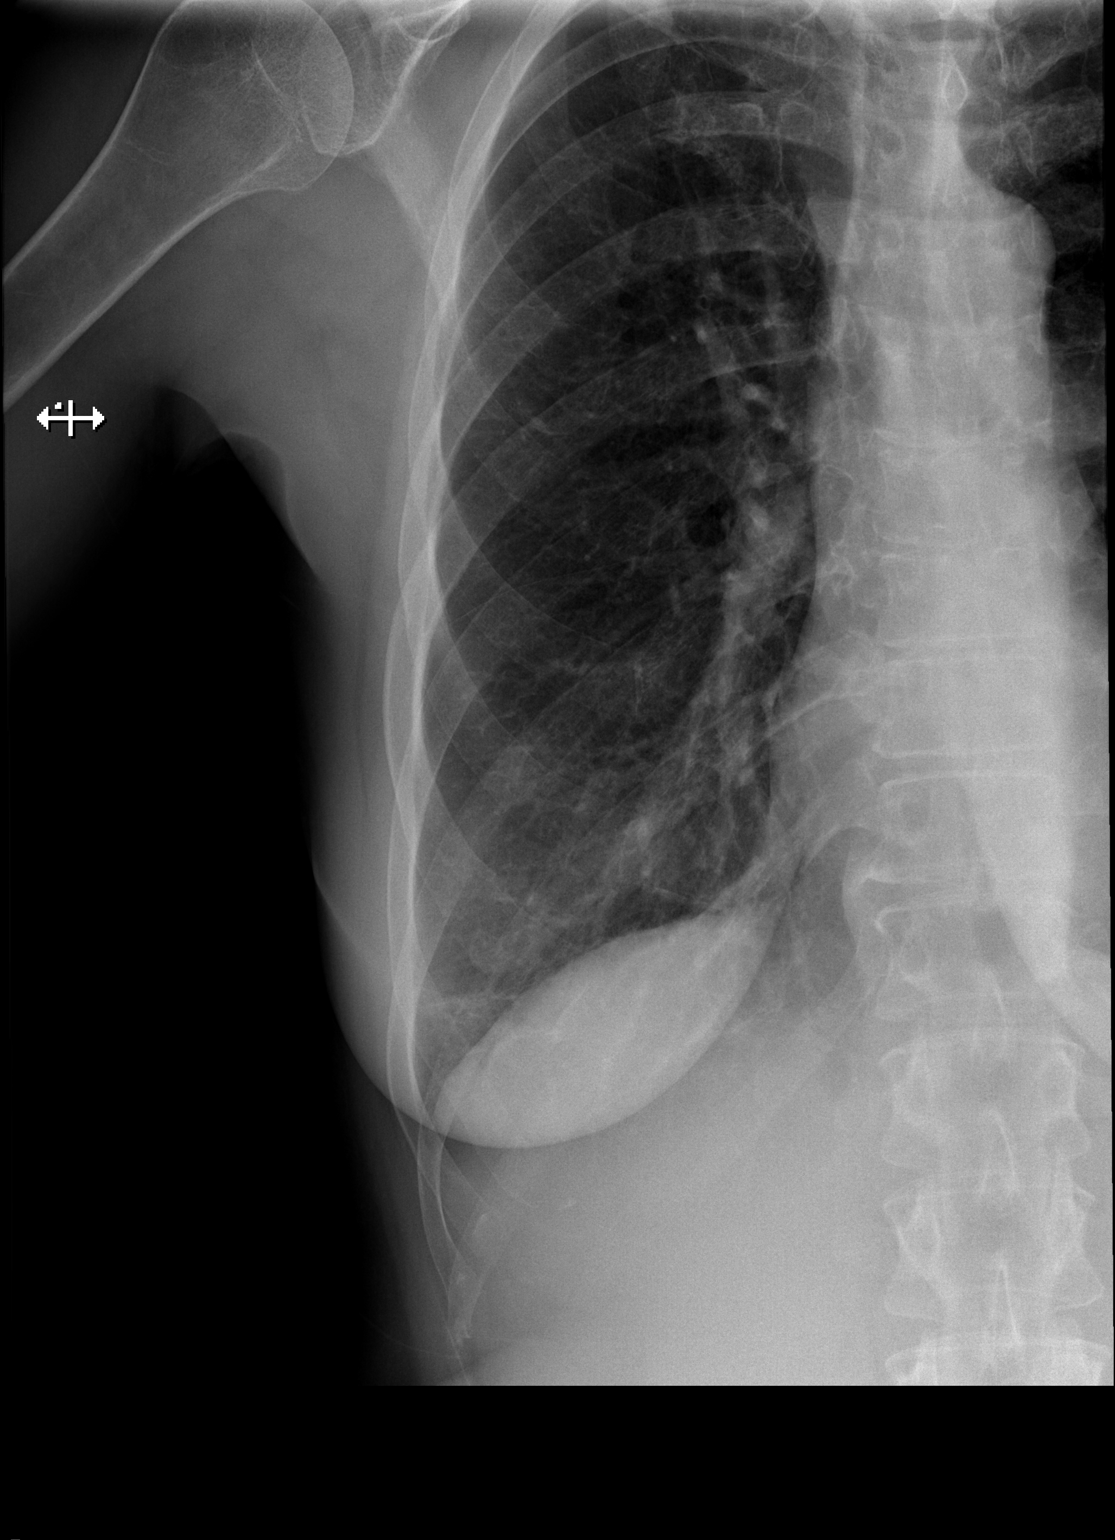

[w chest pa (3 of 3)]
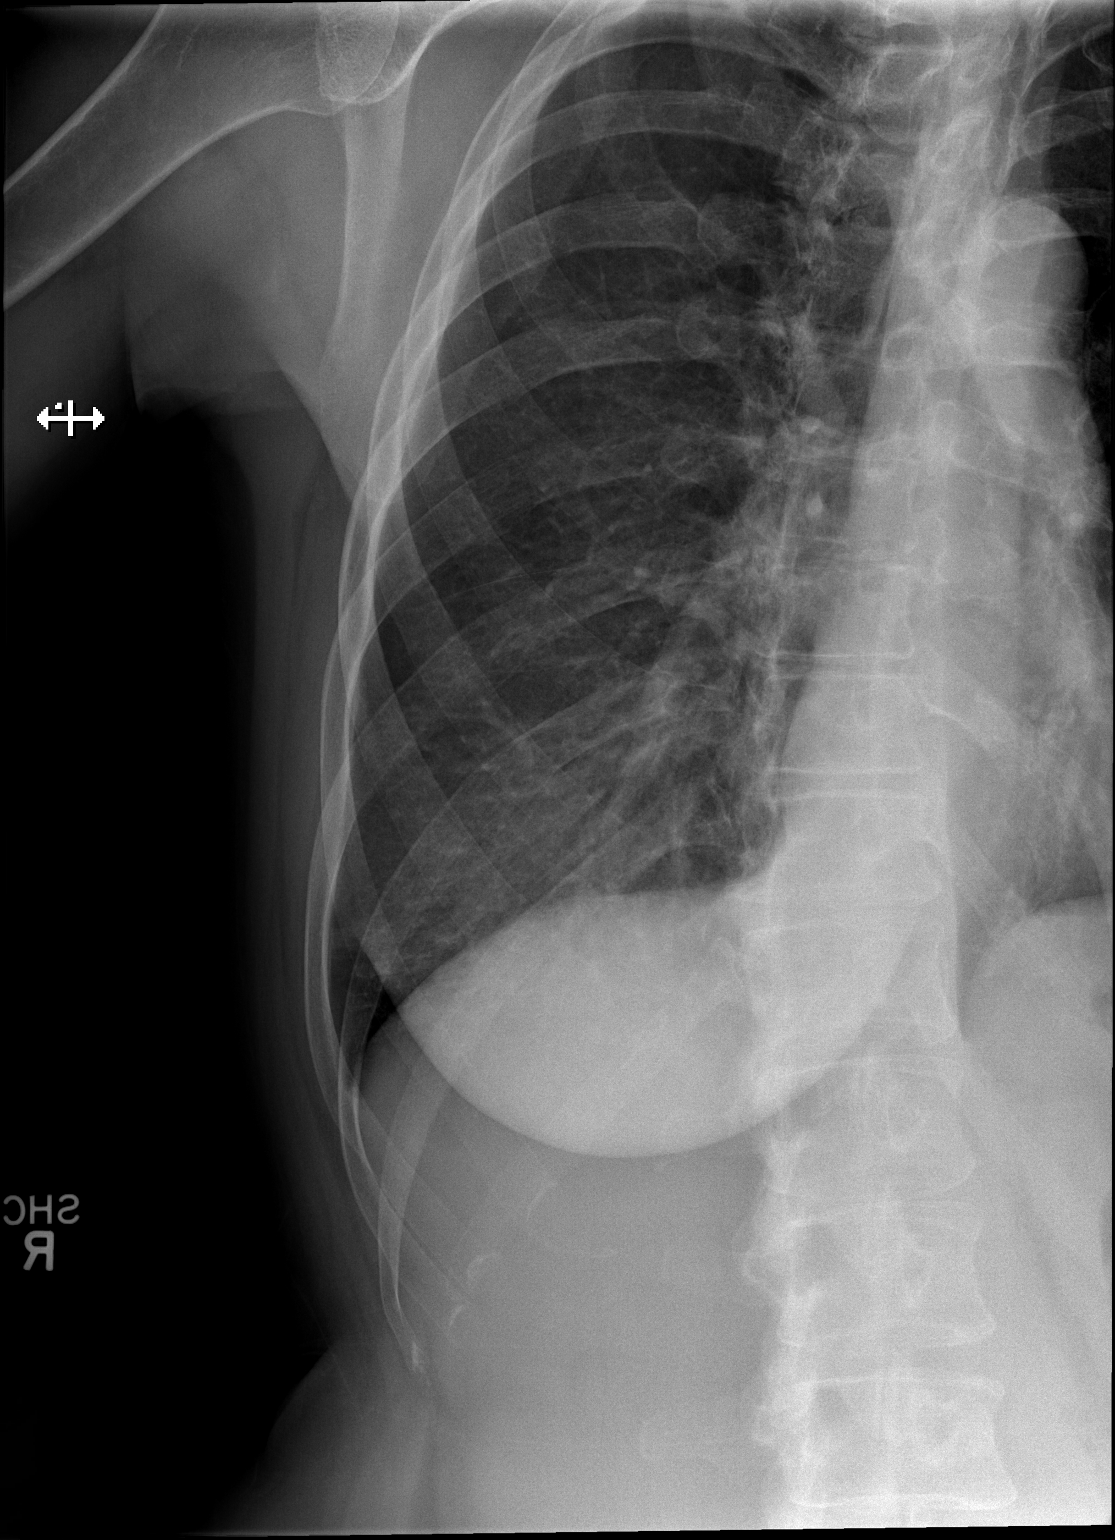

[3 of 3 positions shown; findings below may reference images not displayed]

FINDINGS: COPD with hyperinflation.  Patchy right lower lobe
airspace disease was not present previously and may represent
pneumonia.  No significant pleural effusion.

Right rib detail reveals no rib fracture or rib lesion.
IMPRESSION: Mild right lower lobe infiltrate, possible pneumonia.

Negative for right rib fracture.

## 2013-07-30 NOTE — ED Provider Notes (Signed)
Medical screening examination/treatment/procedure(s) were performed by non-physician practitioner and as supervising physician I was immediately available for consultation/collaboration.   Rjay Revolorio L Jigar Zielke, MD 07/30/13 1122 

## 2015-02-24 ENCOUNTER — Encounter (HOSPITAL_COMMUNITY): Payer: Self-pay | Admitting: *Deleted

## 2015-02-24 ENCOUNTER — Emergency Department (HOSPITAL_COMMUNITY): Payer: Medicaid Other

## 2015-02-24 ENCOUNTER — Inpatient Hospital Stay (HOSPITAL_COMMUNITY)
Admission: EM | Admit: 2015-02-24 | Discharge: 2015-02-28 | DRG: 872 | Disposition: A | Discharge status: Home / Self Care | Payer: Self-pay | Attending: Internal Medicine | Admitting: Internal Medicine

## 2015-02-24 DIAGNOSIS — N390 Urinary tract infection, site not specified: Secondary | ICD-10-CM | POA: Diagnosis present

## 2015-02-24 DIAGNOSIS — Z720 Tobacco use: Secondary | ICD-10-CM | POA: Diagnosis present

## 2015-02-24 DIAGNOSIS — Z87442 Personal history of urinary calculi: Secondary | ICD-10-CM

## 2015-02-24 DIAGNOSIS — Z590 Homelessness: Secondary | ICD-10-CM

## 2015-02-24 DIAGNOSIS — N10 Acute tubulo-interstitial nephritis: Secondary | ICD-10-CM | POA: Diagnosis present

## 2015-02-24 DIAGNOSIS — F17210 Nicotine dependence, cigarettes, uncomplicated: Secondary | ICD-10-CM | POA: Diagnosis present

## 2015-02-24 DIAGNOSIS — F1010 Alcohol abuse, uncomplicated: Secondary | ICD-10-CM | POA: Diagnosis present

## 2015-02-24 DIAGNOSIS — E876 Hypokalemia: Secondary | ICD-10-CM | POA: Diagnosis present

## 2015-02-24 DIAGNOSIS — I10 Essential (primary) hypertension: Secondary | ICD-10-CM | POA: Diagnosis present

## 2015-02-24 DIAGNOSIS — F3110 Bipolar disorder, current episode manic without psychotic features, unspecified: Secondary | ICD-10-CM | POA: Diagnosis present

## 2015-02-24 DIAGNOSIS — Z833 Family history of diabetes mellitus: Secondary | ICD-10-CM

## 2015-02-24 DIAGNOSIS — J449 Chronic obstructive pulmonary disease, unspecified: Secondary | ICD-10-CM | POA: Diagnosis present

## 2015-02-24 DIAGNOSIS — N200 Calculus of kidney: Secondary | ICD-10-CM

## 2015-02-24 DIAGNOSIS — F1910 Other psychoactive substance abuse, uncomplicated: Secondary | ICD-10-CM | POA: Diagnosis present

## 2015-02-24 DIAGNOSIS — F319 Bipolar disorder, unspecified: Secondary | ICD-10-CM | POA: Diagnosis present

## 2015-02-24 DIAGNOSIS — A419 Sepsis, unspecified organism: Principal | ICD-10-CM | POA: Diagnosis present

## 2015-02-24 DIAGNOSIS — B9620 Unspecified Escherichia coli [E. coli] as the cause of diseases classified elsewhere: Secondary | ICD-10-CM | POA: Diagnosis present

## 2015-02-24 DIAGNOSIS — J439 Emphysema, unspecified: Secondary | ICD-10-CM | POA: Diagnosis present

## 2015-02-24 DIAGNOSIS — F1290 Cannabis use, unspecified, uncomplicated: Secondary | ICD-10-CM | POA: Diagnosis present

## 2015-02-24 DIAGNOSIS — D72829 Elevated white blood cell count, unspecified: Secondary | ICD-10-CM | POA: Diagnosis present

## 2015-02-24 DIAGNOSIS — Z809 Family history of malignant neoplasm, unspecified: Secondary | ICD-10-CM

## 2015-02-24 DIAGNOSIS — Z8249 Family history of ischemic heart disease and other diseases of the circulatory system: Secondary | ICD-10-CM

## 2015-02-24 DIAGNOSIS — D696 Thrombocytopenia, unspecified: Secondary | ICD-10-CM | POA: Diagnosis present

## 2015-02-24 LAB — URINE MICROSCOPIC-ADD ON

## 2015-02-24 LAB — URINALYSIS, ROUTINE W REFLEX MICROSCOPIC
Bilirubin Urine: NEGATIVE
GLUCOSE, UA: NEGATIVE mg/dL
KETONES UR: NEGATIVE mg/dL
Nitrite: POSITIVE — AB
PROTEIN: 100 mg/dL — AB
Specific Gravity, Urine: 1.012 (ref 1.005–1.030)
Urobilinogen, UA: 1 mg/dL (ref 0.0–1.0)
pH: 6 (ref 5.0–8.0)

## 2015-02-24 LAB — CBC WITH DIFFERENTIAL/PLATELET
BASOS ABS: 0 10*3/uL (ref 0.0–0.1)
Basophils Relative: 0 %
Eosinophils Absolute: 0 10*3/uL (ref 0.0–0.7)
Eosinophils Relative: 0 %
HEMATOCRIT: 41.9 % (ref 36.0–46.0)
Hemoglobin: 13.9 g/dL (ref 12.0–15.0)
LYMPHS PCT: 8 %
Lymphs Abs: 1 10*3/uL (ref 0.7–4.0)
MCH: 32 pg (ref 26.0–34.0)
MCHC: 33.2 g/dL (ref 30.0–36.0)
MCV: 96.3 fL (ref 78.0–100.0)
MONO ABS: 1 10*3/uL (ref 0.1–1.0)
Monocytes Relative: 8 %
NEUTROS ABS: 10.2 10*3/uL — AB (ref 1.7–7.7)
Neutrophils Relative %: 84 %
Platelets: 137 10*3/uL — ABNORMAL LOW (ref 150–400)
RBC: 4.35 MIL/uL (ref 3.87–5.11)
RDW: 13.9 % (ref 11.5–15.5)
WBC: 12.2 10*3/uL — ABNORMAL HIGH (ref 4.0–10.5)

## 2015-02-24 LAB — POC OCCULT BLOOD, ED: FECAL OCCULT BLD: NEGATIVE

## 2015-02-24 LAB — I-STAT CHEM 8, ED
BUN: 19 mg/dL (ref 6–20)
CALCIUM ION: 1.15 mmol/L (ref 1.12–1.23)
CHLORIDE: 101 mmol/L (ref 101–111)
Creatinine, Ser: 1 mg/dL (ref 0.44–1.00)
GLUCOSE: 130 mg/dL — AB (ref 65–99)
HCT: 41 % (ref 36.0–46.0)
Hemoglobin: 13.9 g/dL (ref 12.0–15.0)
Potassium: 3.4 mmol/L — ABNORMAL LOW (ref 3.5–5.1)
Sodium: 137 mmol/L (ref 135–145)
TCO2: 23 mmol/L (ref 0–100)

## 2015-02-24 LAB — COMPREHENSIVE METABOLIC PANEL
ALT: 31 U/L (ref 14–54)
AST: 31 U/L (ref 15–41)
Albumin: 4.1 g/dL (ref 3.5–5.0)
Alkaline Phosphatase: 51 U/L (ref 38–126)
Anion gap: 9 (ref 5–15)
BILIRUBIN TOTAL: 0.9 mg/dL (ref 0.3–1.2)
BUN: 20 mg/dL (ref 6–20)
CALCIUM: 9.4 mg/dL (ref 8.9–10.3)
CO2: 24 mmol/L (ref 22–32)
CREATININE: 1.06 mg/dL — AB (ref 0.44–1.00)
Chloride: 105 mmol/L (ref 101–111)
GFR calc Af Amer: >60 mL/min (ref >60)
GFR, EST NON AFRICAN AMERICAN: 58 mL/min — AB (ref >60)
Glucose, Bld: 134 mg/dL — ABNORMAL HIGH (ref 65–99)
Potassium: 3.5 mmol/L (ref 3.5–5.1)
Sodium: 138 mmol/L (ref 135–145)
TOTAL PROTEIN: 6.4 g/dL — AB (ref 6.5–8.1)

## 2015-02-24 MED ORDER — IOHEXOL 300 MG/ML  SOLN
100.0000 mL | Freq: Once | INTRAMUSCULAR | Status: AC | PRN
  Administered 2015-02-24: 100 mL via INTRAVENOUS

## 2015-02-24 MED ORDER — DEXTROSE 5 % IV SOLN
1.0000 g | Freq: Once | INTRAVENOUS | Status: AC
  Administered 2015-02-25: 1 g via INTRAVENOUS
  Filled 2015-02-24: qty 10

## 2015-02-24 MED ORDER — SODIUM CHLORIDE 0.9 % IV SOLN
1000.0000 mL | Freq: Once | INTRAVENOUS | Status: AC
  Administered 2015-02-24: 1000 mL via INTRAVENOUS

## 2015-02-24 MED ORDER — ACETAMINOPHEN 325 MG PO TABS
650.0000 mg | ORAL_TABLET | Freq: Once | ORAL | Status: AC
  Administered 2015-02-24: 650 mg via ORAL
  Filled 2015-02-24: qty 2

## 2015-02-24 MED ORDER — IOHEXOL 300 MG/ML  SOLN
50.0000 mL | Freq: Once | INTRAMUSCULAR | Status: AC | PRN
  Administered 2015-02-24: 50 mL via ORAL

## 2015-02-24 NOTE — ED Provider Notes (Signed)
CSN: [id number]     Arrival date & time 02/24/15  2128 History   First MD Initiated Contact with Patient 02/24/15 2207     Chief Complaint  Patient presents with  . Flank Pain  . Rectal Bleeding     (Consider location/radiation/quality/duration/timing/severity/associated sxs/prior Treatment) HPI [name] is a 54 y.o. female history of bipolar 1 disorder, polysubstance abuse, schizophrenia comes in for evaluation of bilateral flank pain, bloody stool. Patient is overall poor historian. Patient reports her symptoms have been ongoing for the past week and she reports "kidney pain in my back" and that it feels like "I'm being stabbed with a 2 inch sword". She denies fevers or chills at home, although she admits she is currently homeless. No nausea or vomiting. No vaginal bleeding or discharge. She does report dysuria and lower abdominal pressure. No other aggravating or modifying factors. She also reports she was recently released from prison in August and has not been able to secure insurance.  Past Medical History  Diagnosis Date  . Bipolar 1 disorder (HCC)   . Emphysema   . Polysubstance abuse   . Bronchitis   . Emphysema   . Asthma   . Hypertension   . Schizophrenia [facility])    Past Surgical History  Procedure Laterality Date  . Abdominal hysterectomy    . Knee surgery      laser to left  . Appendectomy     Family History  Problem Relation Age of Onset  . Rheum arthritis Mother   . Cancer Mother   . Diabetes Mother   . Hypertension Mother   . Rheum arthritis Father   . Cancer Father   . Diabetes Father   . Hypertension Father    Social History  Substance Use Topics  . Smoking status: Current Every Day Smoker -- 0.50 packs/day    Types: Cigarettes  . Smokeless tobacco: Never Used  . Alcohol Use: No   OB History    No data available     Review of Systems A 10 point review of systems was completed and was negative except for pertinent positives and negatives as  mentioned in the history of present illness    Allergies  Cortisone and Other  Home Medications   Prior to Admission medications   Not on File   BP 123/72 mmHg  Pulse 106  Temp(Src) 98.1 F (36.7 C) (Oral)  Resp 20  SpO2 98% Physical Exam  Constitutional: She is oriented to person, place, and time. She appears well-developed and well-nourished.  HENT:  Head: Normocephalic and atraumatic.  Mouth/Throat: Oropharynx is clear and moist.  Eyes: Conjunctivae are normal. Pupils are equal, round, and reactive to light. Right eye exhibits no discharge. Left eye exhibits no discharge. No scleral icterus.  Neck: Neck supple.  Cardiovascular: Normal rate, regular rhythm and normal heart sounds.   Pulmonary/Chest: Effort normal and breath sounds normal. No respiratory distress. She has no wheezes. She has no rales.  Abdominal: Soft.  Mild suprapubic tenderness. Abdomen otherwise soft, nondistended without rebound or guarding or other focal tenderness. No CVA tenderness  Musculoskeletal: She exhibits no tenderness.  Neurological: She is alert and oriented to person, place, and time.  Cranial Nerves II-XII grossly intact  Skin: Skin is warm and dry. No rash noted.  Psychiatric: She has a normal mood and affect.  Pressure speech  Nursing note and vitals reviewed.   ED Course  Procedures (including critical care time) Labs Review Labs Reviewed  CBC  WITH DIFFERENTIAL/PLATELET - Abnormal; Notable for the following:    WBC 12.2 (*)    Platelets 137 (*)    Neutro Abs 10.2 (*)    All other components within normal limits  COMPREHENSIVE METABOLIC PANEL - Abnormal; Notable for the following:    Glucose, Bld 134 (*)    Creatinine, Ser 1.06 (*)    Total Protein 6.4 (*)    GFR calc non Af Amer 58 (*)    All other components within normal limits  URINALYSIS, ROUTINE W REFLEX MICROSCOPIC (NOT AT [facility]) - Abnormal; Notable for the following:    APPearance TURBID (*)    Hgb urine dipstick  MODERATE (*)    Protein, ur 100 (*)    Nitrite POSITIVE (*)    Leukocytes, UA LARGE (*)    All other components within normal limits  URINE MICROSCOPIC-ADD ON - Abnormal; Notable for the following:    Bacteria, UA MANY (*)    All other components within normal limits  I-STAT CHEM 8, ED - Abnormal; Notable for the following:    Potassium 3.4 (*)    Glucose, Bld 130 (*)    All other components within normal limits  URINE CULTURE  POC OCCULT BLOOD, ED    Imaging Review Ct Abdomen Pelvis W Contrast  02/25/2015  CLINICAL DATA:  Bilateral flank pain. Blood-streaked stool. Three days duration. EXAM: CT ABDOMEN AND PELVIS WITH CONTRAST TECHNIQUE: Multidetector CT imaging of the abdomen and pelvis was performed using the standard protocol following bolus administration of intravenous contrast. CONTRAST:  50mL OMNIPAQUE IOHEXOL 300 MG/ML SOLN, OMNIPAQUE IOHEXOL 300 MG/ML SOLN COMPARISON:  04/04/2012 FINDINGS: Lower chest:  No significant abnormality Hepatobiliary: There are normal appearances of the liver, gallbladder and bile ducts. Pancreas: Normal Spleen: Normal Adrenals/Urinary Tract: There are collecting system calculi and both kidneys, measuring up to 3 mm. No ureteral calculi. No hydronephrosis. Adrenals are normal. Unremarkable urinary bladder. Stomach/Bowel: The stomach, small bowel and colon appear unremarkable. Vascular/Lymphatic: The abdominal aorta is normal in caliber. There is mild atherosclerotic calcification. There is no adenopathy in the abdomen or pelvis. Reproductive: Hysterectomy.  No adnexal abnormalities. Other: No acute inflammatory changes are evident in the abdomen or pelvis. There is no ascites. Musculoskeletal: No significant abnormality. IMPRESSION: Bilateral nephrolithiasis. No acute findings are evident in the abdomen or pelvis. Electronically Signed   By: Ellery Plunk M.D.   On: 02/25/2015 00:07   I have personally reviewed and evaluated these images and lab  results as part of my medical decision-making.   EKG Interpretation None     Meds given in ED:  Medications  0.9 %  sodium chloride infusion (1,000 mLs Intravenous New Bag/Given 02/24/15 2212)  acetaminophen (TYLENOL) tablet 650 mg (650 mg Oral Given 02/24/15 2212)  iohexol (OMNIPAQUE) 300 MG/ML solution 50 mL (50 mLs Oral Contrast Given 02/24/15 2324)  iohexol (OMNIPAQUE) 300 MG/ML solution 100 mL (100 mLs Intravenous Contrast Given 02/24/15 2346)  cefTRIAXone (ROCEPHIN) 1 g in dextrose 5 % 50 mL IVPB (1 g Intravenous New Bag/Given 02/25/15 0019)    New Prescriptions   No medications on file   Filed Vitals:   02/24/15 2148 02/24/15 2357 02/25/15 0020  BP: 121/69 123/72   Pulse: 101 106   Temp: 102.8 F (39.3 C)  98.1 F (36.7 C)  TempSrc: Oral  Oral  Resp: 22 20   SpO2: 97% 98%     MDM  [name] is a 54 y.o. female with history of schizophrenia,  bipolar disorder, polysubstance abuse, homelessness, recently released from prison, comes in for evaluation of fever and flank pain. On arrival, patient is febrile to 102.95F, tachycardic at 106. Patient is moderately tender in the suprapubic region. No CVA tenderness. Urinalysis shows evidence of UTI. Due to patient's past medical history and difficulty obtaining thorough history of present illness, obtain CT abdomen for further elucidation of patient's symptoms. CT abdomen is grossly unremarkable and shows that there is bilateral nephrolithiasis without any hydronephrosis. At this time, I believe patient would benefit from inpatient admission for IV antibiotics for her UTI. She is unable to follow up outpatient and will not be able to fill any prescriptions for medications. Discussed with medical service, Dr. Robb Matarrtiz to see in the ED and admit.   Final diagnoses:  UTI (lower urinary tract infection)       Joycie PeekBenjamin Cartner, PA-C 02/25/15 0109  Medical screening examination/treatment/procedure(s) were conducted as a shared  visit with non-physician practitioner(s) and myself.  I personally evaluated the patient during the encounter.   EKG Interpretation None     Fever, bilat flank pain, infected urine.  Admit for hydration and IV antibiotics.  Donnetta HutchingBrian Doren Kaspar, MD 02/25/15 [phone]

## 2015-02-24 NOTE — ED Notes (Signed)
Bed: WA03 Expected date:  Expected time:  Means of arrival:  Comments: Flank pain/rectal bleed

## 2015-02-24 NOTE — ED Notes (Signed)
[facility] that she has had bilateral flank pain and blood streaked stool for 3 days. Patient feels that she has kidney stones. Patient has history of copd and diabetes maybe.

## 2015-02-25 ENCOUNTER — Encounter (HOSPITAL_COMMUNITY): Payer: Self-pay | Admitting: Internal Medicine

## 2015-02-25 DIAGNOSIS — D72829 Elevated white blood cell count, unspecified: Secondary | ICD-10-CM | POA: Diagnosis present

## 2015-02-25 DIAGNOSIS — F1910 Other psychoactive substance abuse, uncomplicated: Secondary | ICD-10-CM | POA: Diagnosis present

## 2015-02-25 DIAGNOSIS — Z720 Tobacco use: Secondary | ICD-10-CM | POA: Diagnosis present

## 2015-02-25 DIAGNOSIS — J439 Emphysema, unspecified: Secondary | ICD-10-CM | POA: Diagnosis present

## 2015-02-25 DIAGNOSIS — N10 Acute tubulo-interstitial nephritis: Secondary | ICD-10-CM | POA: Diagnosis present

## 2015-02-25 DIAGNOSIS — I10 Essential (primary) hypertension: Secondary | ICD-10-CM | POA: Diagnosis present

## 2015-02-25 DIAGNOSIS — D696 Thrombocytopenia, unspecified: Secondary | ICD-10-CM | POA: Diagnosis present

## 2015-02-25 DIAGNOSIS — F3110 Bipolar disorder, current episode manic without psychotic features, unspecified: Secondary | ICD-10-CM

## 2015-02-25 DIAGNOSIS — A419 Sepsis, unspecified organism: Secondary | ICD-10-CM | POA: Diagnosis present

## 2015-02-25 DIAGNOSIS — E876 Hypokalemia: Secondary | ICD-10-CM | POA: Diagnosis present

## 2015-02-25 DIAGNOSIS — N390 Urinary tract infection, site not specified: Secondary | ICD-10-CM

## 2015-02-25 DIAGNOSIS — N200 Calculus of kidney: Secondary | ICD-10-CM

## 2015-02-25 LAB — CBC
HCT: 41.2 % (ref 36.0–46.0)
Hemoglobin: 13.6 g/dL (ref 12.0–15.0)
MCH: 31.7 pg (ref 26.0–34.0)
MCHC: 33 g/dL (ref 30.0–36.0)
MCV: 96 fL (ref 78.0–100.0)
Platelets: 108 10*3/uL — ABNORMAL LOW (ref 150–400)
RBC: 4.29 MIL/uL (ref 3.87–5.11)
RDW: 13.9 % (ref 11.5–15.5)
WBC: 15.3 10*3/uL — ABNORMAL HIGH (ref 4.0–10.5)

## 2015-02-25 LAB — MRSA PCR SCREENING: MRSA by PCR: NEGATIVE

## 2015-02-25 LAB — COMPREHENSIVE METABOLIC PANEL
ALT: 26 U/L (ref 14–54)
AST: 29 U/L (ref 15–41)
Albumin: 3.4 g/dL — ABNORMAL LOW (ref 3.5–5.0)
Alkaline Phosphatase: 51 U/L (ref 38–126)
Anion gap: 9 (ref 5–15)
BUN: 17 mg/dL (ref 6–20)
CO2: 23 mmol/L (ref 22–32)
Calcium: 8.7 mg/dL — ABNORMAL LOW (ref 8.9–10.3)
Chloride: 107 mmol/L (ref 101–111)
Creatinine, Ser: 1.05 mg/dL — ABNORMAL HIGH (ref 0.44–1.00)
GFR calc Af Amer: >60 mL/min (ref >60)
GFR calc non Af Amer: 58 mL/min — ABNORMAL LOW (ref >60)
Glucose, Bld: 147 mg/dL — ABNORMAL HIGH (ref 65–99)
Potassium: 3.3 mmol/L — ABNORMAL LOW (ref 3.5–5.1)
Sodium: 139 mmol/L (ref 135–145)
Total Bilirubin: 0.9 mg/dL (ref 0.3–1.2)
Total Protein: 5.8 g/dL — ABNORMAL LOW (ref 6.5–8.1)

## 2015-02-25 LAB — MAGNESIUM: Magnesium: 1.8 mg/dL (ref 1.7–2.4)

## 2015-02-25 MED ORDER — DEXTROSE 5 % IV SOLN
1.0000 g | INTRAVENOUS | Status: DC
  Administered 2015-02-25 – 2015-02-26 (×2): 1 g via INTRAVENOUS
  Filled 2015-02-25 (×3): qty 10

## 2015-02-25 MED ORDER — SODIUM CHLORIDE 0.9 % IJ SOLN
3.0000 mL | Freq: Two times a day (BID) | INTRAMUSCULAR | Status: DC
  Administered 2015-02-25 – 2015-02-26 (×2): 3 mL via INTRAVENOUS

## 2015-02-25 MED ORDER — ONDANSETRON HCL 4 MG/2ML IJ SOLN: 4.0000 mg | Freq: Four times a day (QID) | INTRAMUSCULAR | Status: DC | PRN

## 2015-02-25 MED ORDER — LORAZEPAM 2 MG/ML IJ SOLN
2.0000 mg | Freq: Once | INTRAMUSCULAR | Status: AC
  Administered 2015-02-25: 2 mg via INTRAVENOUS
  Filled 2015-02-25: qty 1

## 2015-02-25 MED ORDER — KETOROLAC TROMETHAMINE 30 MG/ML IJ SOLN
30.0000 mg | Freq: Once | INTRAMUSCULAR | Status: AC
  Administered 2015-02-25: 30 mg via INTRAVENOUS
  Filled 2015-02-25: qty 1

## 2015-02-25 MED ORDER — HYDROMORPHONE HCL 1 MG/ML IJ SOLN
1.0000 mg | INTRAMUSCULAR | Status: DC | PRN
Start: 2015-02-25 — End: 2015-02-28

## 2015-02-25 MED ORDER — POTASSIUM CHLORIDE IN NACL 20-0.9 MEQ/L-% IV SOLN
INTRAVENOUS | Status: DC
  Administered 2015-02-25 (×2): via INTRAVENOUS
  Filled 2015-02-25 (×5): qty 1000

## 2015-02-25 MED ORDER — ONDANSETRON HCL 4 MG PO TABS: 4.0000 mg | ORAL_TABLET | Freq: Four times a day (QID) | ORAL | Status: DC | PRN

## 2015-02-25 NOTE — Progress Notes (Signed)
Pt demanding to have all of her makeup given to her at this time. Elsie StainS. Marshall, [facility] and this RN searched through patient's belongings per patient's consent and gave patient 1 tube of lip gloss, 1 tube of chapstick, 2 tubes of mascara, 1 tube of nail polish, and 1 tube of liquid foundation.  Will continue to monitor pt closely. Mardene CelesteAsaro, Makensie Mulhall I

## 2015-02-25 NOTE — Progress Notes (Signed)
Patient took a two hour long shower and is now refusing to be put back on telemtery. MD notified. CCMD notified.

## 2015-02-25 NOTE — Progress Notes (Addendum)
PT admitted after midnight Admission for nephrolithiasis - Sepsis criteria met with fever of 102.8 F, tachycardia, RR 22 and leukocytosis - Continue to monitor renal function - Continue rocephin - If no improvement then call GU. - Supplement potassium through IV fluids  Leisa Lenz [facility] [phone]

## 2015-02-25 NOTE — Clinical Social Work Note (Signed)
CSW attempted to meed with pt at bedside but she was sleeping and would not wake up.  CSW will follow up when pt is awake to complete psychosocial assessment.  Isabella Buba.[name], LCSW [facility] Clinical Social Worker - Weekend Coverage cell #: [phone]

## 2015-02-25 NOTE — H&P (Signed)
[facility] History and Physical  Isabella DubsBetty F Runco ZOX:[id number] DOB: 1960/10/22 DOA: 02/24/2015  Referring physician: Joycie PeekBenjamin Cartner, PA-C PCP: Pcp Not In System   Chief Complaint: Flank Pain  HPI: [name] is a 54 y.o. female with past medical history of nephrolithiasis, bipolar disorder, schizophrenia, emphysema, polysubstance abuse who came into the emergency department due to lateral flank pain, suprapubic tenderness and fever. She also complaining of hematochezia for the past 3 days. She is currently homeless, has a diagnosis of bipolar disorder and schizophrenia and has not taken her medications since she was released from prison in August. When I went to interview the patient, she was not very cooperative, her speech was pressured, tone of voice was loud and constantly kept interrupting requesting for something, wanting to get out of bed or going off subject. I obtained a sandwich and a soda, since she stated she was hungry. However, after this, she did not provide further information then already was known from earlier evaluation.    Review of Systems:  Unable to review due to the patient constantly demanding a warm blanket despite having a fever.  Past Medical History  Diagnosis Date  . Bipolar 1 disorder (HCC)   . Emphysema   . Polysubstance abuse   . Bronchitis   . Emphysema   . Asthma   . Hypertension   . Schizophrenia [facility](HCC)    Past Surgical History  Procedure Laterality Date  . Abdominal hysterectomy    . Knee surgery      laser to left  . Appendectomy     Social History:  reports that she has been smoking Cigarettes.  She has been smoking about 0.50 packs per day. She has never used smokeless tobacco. She reports that she uses illicit drugs (Marijuana, "Crack" cocaine, and Cocaine). She reports that she does not drink alcohol.  Allergies  Allergen Reactions  . Cortisone Nausea And Vomiting    Also caused a fever  . Other Other (See Comments)   Unknown antibiotic caused fluid retention    Family History  Problem Relation Age of Onset  . Rheum arthritis Mother   . Cancer Mother   . Diabetes Mother   . Hypertension Mother   . Rheum arthritis Father   . Cancer Father   . Diabetes Father   . Hypertension Father      Prior to Admission medications   Not on File   Physical Exam: Filed Vitals:   02/24/15 2357 02/25/15 0020 02/25/15 0105 02/25/15 0151  BP: 123/72  145/72   Pulse: 106  112   Temp:  98.1 F (36.7 C) 98.4 F (36.9 C) 100.9 F (38.3 C)  TempSrc:  Oral Oral Oral  Resp: 20  20   Height:   5\' 4"  (1.626 m)   Weight:   82 kg (180 lb 12.4 oz)   SpO2: 98%  95%     Wt Readings from Last 3 Encounters:  02/25/15 82 kg (180 lb 12.4 oz)  07/20/13 72.576 kg (160 lb)  12/05/12 83.915 kg (185 lb)    General:  Appears calm and comfortable Eyes: PERRL, normal lids, irises & conjunctiva ENT: grossly normal hearing, lips & tongue Neck: no LAD, masses or thyromegaly Cardiovascular: RRR, no m/r/g. No LE edema. Telemetry: SR, no arrhythmias  Respiratory: CTA bilaterally, no w/r/r. Normal respiratory effort. Abdomen: soft, positive suprapubic tenderness positive bilateral CVA tenderness. Skin: no rash or induration seen on limited exam Musculoskeletal: grossly normal tone BUE/BLE Psychiatric: Mood  was anxious and hyperactive. Speech was pressured and erratic. Neurologic: grossly non-focal.          Labs on Admission:  Basic Metabolic Panel:  Recent Labs Lab 02/24/15 2204 02/24/15 2214  NA 138 137  K 3.5 3.4*  CL 105 101  CO2 24  --   GLUCOSE 134* 130*  BUN 20 19  CREATININE 1.06* 1.00  CALCIUM 9.4  --    Liver Function Tests:  Recent Labs Lab 02/24/15 2204  AST 31  ALT 31  ALKPHOS 51  BILITOT 0.9  PROT 6.4*  ALBUMIN 4.1   CBC:  Recent Labs Lab 02/24/15 2204 02/24/15 2214  WBC 12.2*  --   NEUTROABS 10.2*  --   HGB 13.9 13.9  HCT 41.9 41.0  MCV 96.3  --   PLT 137*  --      Radiological Exams on Admission: Ct Abdomen Pelvis W Contrast  02/25/2015  CLINICAL DATA:  Bilateral flank pain. Blood-streaked stool. Three days duration. EXAM: CT ABDOMEN AND PELVIS WITH CONTRAST TECHNIQUE: Multidetector CT imaging of the abdomen and pelvis was performed using the standard protocol following bolus administration of intravenous contrast. CONTRAST:  50mL OMNIPAQUE IOHEXOL 300 MG/ML SOLN, OMNIPAQUE IOHEXOL 300 MG/ML SOLN COMPARISON:  04/04/2012 FINDINGS: Lower chest:  No significant abnormality Hepatobiliary: There are normal appearances of the liver, gallbladder and bile ducts. Pancreas: Normal Spleen: Normal Adrenals/Urinary Tract: There are collecting system calculi and both kidneys, measuring up to 3 mm. No ureteral calculi. No hydronephrosis. Adrenals are normal. Unremarkable urinary bladder. Stomach/Bowel: The stomach, small bowel and colon appear unremarkable. Vascular/Lymphatic: The abdominal aorta is normal in caliber. There is mild atherosclerotic calcification. There is no adenopathy in the abdomen or pelvis. Reproductive: Hysterectomy.  No adnexal abnormalities. Other: No acute inflammatory changes are evident in the abdomen or pelvis. There is no ascites. Musculoskeletal: No significant abnormality. IMPRESSION: Bilateral nephrolithiasis. No acute findings are evident in the abdomen or pelvis. Electronically Signed   By: Ellery Plunk M.D.   On: 02/25/2015 00:07     Assessment/Plan Principal Problem:   Acute pyelonephritis   Bilateral Nephrolithiasis Continue IV hydration. Continue analgesics as needed. Continue IV ceftriaxone. Follow-up urine culture and sensitivity. Consider inpatient evaluation by urology if no relief.  Active Problems:   Bipolar 1 disorder (HCC) The patient seems to be in manic phase, she does not know what kind of medication she was using. She is currently febrile, very restless, anxious and wants to insistently get warm blankets  despite fever. Sedation was ordered so the patient can rest and does not interrupt treatment.    Essential hypertension Not on any medication at the moment. Low-sodium diet. Consider antihypertensive therapy if blood pressure increases.    Polysubstance abuse Per patient, she is only using Cannabis at this time.    Emphysema/COPD (HCC) No wheezing noticed some physical exam. Bronchodilators as needed if symptoms appear.    Tobacco abuse disorder Offered nicotine replacement therapy. The patient neither accepted or declined it.      Code Status: Full Code. DVT Prophylaxis: Mechanical with SCDs. Family Communication:  Disposition Plan: Admit for IV hydration and IV antibiotic therapy.  Time spent: Over 70 minutes were spent during the process of this admission.  Bobette Mo [facility] Pager [phone].

## 2015-02-25 NOTE — Progress Notes (Signed)
After admission to the fourth floor several items were discovered in her belongings. Drug paraphernalia, including residue from drugs found in a sponge bon square pants sandwich container. All items locked up. When she woke up this morning she began asking for her things and I explained to her that she could have them once she was discharged. She began screaming she wanted her personal items. [facility] and she came up to speak with her. She is remained calm after her telephone nu,bers were found and given to her along with her wallet. All of the rest of her belongings are locked in the suicide cabinet. Will continue to monitor.

## 2015-02-25 NOTE — Progress Notes (Signed)
At shift change, pt refusing to be hooked up to IV fluids and telemetry despite education by RN of need of pt to keep hydrated and watch heart rhythm. Will discuss with pt again at a later time. Will continue to monitor pt closely. Mardene CelesteAsaro, Cari Burgo I

## 2015-02-25 NOTE — ED Notes (Signed)
Patient is very loud, anxious and demanding. She demands food and drink but complains of nausea. Patient is upset at this time because she wants blankets despite having a fever. This has been explained to the patient.

## 2015-02-25 NOTE — Progress Notes (Signed)
Utilization Review Completed.Nayel Purdy T11/09/2014  

## 2015-02-25 NOTE — Progress Notes (Signed)
While assessing belongings crack pipe fell out of pants pocket. RN noticed spongebob lunch container, contained several pieces of glass, wire with wire cutters, matches and pipe cleaner. All items placed in belongings bag and locked in cabinet on west side to ensure patient safety.

## 2015-02-26 DIAGNOSIS — D696 Thrombocytopenia, unspecified: Secondary | ICD-10-CM

## 2015-02-26 DIAGNOSIS — Z720 Tobacco use: Secondary | ICD-10-CM

## 2015-02-26 DIAGNOSIS — D72829 Elevated white blood cell count, unspecified: Secondary | ICD-10-CM

## 2015-02-26 DIAGNOSIS — N10 Acute tubulo-interstitial nephritis: Secondary | ICD-10-CM

## 2015-02-26 DIAGNOSIS — F1910 Other psychoactive substance abuse, uncomplicated: Secondary | ICD-10-CM

## 2015-02-26 DIAGNOSIS — A419 Sepsis, unspecified organism: Principal | ICD-10-CM

## 2015-02-26 DIAGNOSIS — N390 Urinary tract infection, site not specified: Secondary | ICD-10-CM

## 2015-02-26 DIAGNOSIS — I10 Essential (primary) hypertension: Secondary | ICD-10-CM

## 2015-02-26 LAB — BASIC METABOLIC PANEL
ANION GAP: 3 — AB (ref 5–15)
BUN: 13 mg/dL (ref 6–20)
CHLORIDE: 109 mmol/L (ref 101–111)
CO2: 26 mmol/L (ref 22–32)
Calcium: 8.8 mg/dL — ABNORMAL LOW (ref 8.9–10.3)
Creatinine, Ser: 0.84 mg/dL (ref 0.44–1.00)
GFR calc Af Amer: >60 mL/min (ref >60)
GFR calc non Af Amer: >60 mL/min (ref >60)
GLUCOSE: 168 mg/dL — AB (ref 65–99)
POTASSIUM: 4 mmol/L (ref 3.5–5.1)
SODIUM: 138 mmol/L (ref 135–145)

## 2015-02-26 MED ORDER — SODIUM CHLORIDE 0.9 % IV SOLN
INTRAVENOUS | Status: DC
Start: 2015-02-26 — End: 2015-02-26

## 2015-02-26 MED ORDER — ACETAMINOPHEN 325 MG PO TABS
650.0000 mg | ORAL_TABLET | Freq: Four times a day (QID) | ORAL | Status: DC | PRN
  Administered 2015-02-26 – 2015-02-28 (×4): 650 mg via ORAL
  Filled 2015-02-26 (×5): qty 2

## 2015-02-26 NOTE — Progress Notes (Cosign Needed)
Charge Nurse alerted that patient had drug paraphenelia in belongings that were locked up in suicide cabinet on west side.  Security called to search belongings in bag.  A crack pipe, unidentified pills and pieces of pills, and a scant amount of mariauana found.  Pills were flushed down the toilet with 2 witness, crack pipe and marijuana were given to security for proper disposal.  AC, Clista BernhardtJoe Perez, was witness of the events. Nino Parsleyark, Cordel Drewes B

## 2015-02-26 NOTE — Progress Notes (Addendum)
Patient ID: [name], female   DOB: 11-14-1960, 54 y.o.   MRN: [id number] [facility] PROGRESS NOTE  [name] [id number] DOB: 01-08-1961 DOA: 02/24/2015 PCP: Pcp Not In System will [facility] up with [facility] on discharge   Brief narrative:    54 y.o. female with past medical history of nephrolithiasis, bipolar disorder, schizophrenia, emphysema, polysubstance abuse who presented to [facility] ED with reports of bilateral flank pain and tenderness in suprapubic region as well as associated fevers.patient was found to have UTI on the admission and was started on empiric Rocephin. Of note, she is currently homeless and recently was in prison in August 2016.  Anticipated discharge: once urine culture results available   Assessment/Plan:    Principal Problem:   Sepsis secondary to UTI ([place]) /  Acute pyelonephritis /  Leukocytosis - Sepsis criteria met with fever of 102.8 F, tachycardia, RR 22 and leukocytosis - source of infection thought to be urinary tract infection, acute pyelonephritis - Continue Rocephin - Urine culture pending at this time   Active Problems:   Bilateral Nephrolithiasis - Seen on CT abdomen - No obstruction, voiding freely    Bipolar I disorder, most recent episode (or current) manic ([place]) - Stable - Will need to see with psych if meds can be restarted but [facility] does not have meds listed for bipolar disorder     Hypokalemia - Likely secondary to sepsis and urinary tract infection - Supplemented through IV fluids - Follow-up BMP today    Thrombocytopenia (HCC) - Likely secondary to bone marrow suppression from alcohol abuse, polysubstance abuse - Platelet count 108. - No evidence of bleeding    Polysubstance abuse - UDS not obtained on the admission  - alcohol level not obtained at the time of the admission    Emphysema/COPD ([place]) - Stable  - Not in exacerbation     Tobacco abuse disorder - We will order nicotine patch upon patient request   DVT  Prophylaxis  - SCD's bilaterally   Code Status: Full.  Family Communication:  plan of care discussed with the patient Disposition Plan: Home once urine culture results back   IV access:  Peripheral IV  Procedures and diagnostic studies:    Ct Abdomen Pelvis W Contrast 02/25/2015   Bilateral nephrolithiasis. No acute findings are evident in the abdomen or pelvis. Electronically Signed   By: Andreas Newport M.D.   On: 02/25/2015 00:07   Medical Consultants:  None   Other Consultants:  None   IAnti-Infectives:   Rocephin    Isabella Lenz, MD  [facility] Pager [phone]  Time spent in minutes: 25 minutes  If 7PM-7AM, please contact night-coverage [email or web address] Password [email or web address] 02/26/2015, 10:38 AM   LOS: 1 day    HPI/Subjective: No acute overnight events. Patient reports still flank pain bilaterally.  Objective: Filed Vitals:   02/25/15 0657 02/25/15 1339 02/25/15 2350 02/26/15 0623  BP: 115/51 115/67 131/58 117/70  Pulse: 81 101 106 107  Temp: 98.4 F (36.9 C) 98.6 F (37 C) 100.1 F (37.8 C) 99.5 F (37.5 C)  TempSrc: Oral Oral Oral Oral  Resp: 20 21 16 18   Height:      Weight:      SpO2: 98% 96% 96% 93%    Intake/Output Summary (Last 24 hours) at 02/26/15 1038 Last data filed at 02/26/15 0700  Gross per 24 hour  Intake 3020.83 ml  Output   1400 ml  Net 1620.83 ml    Exam:   General:  Pt is alert, follows commands appropriately, not in acute distress  Cardiovascular: Regular rate and rhythm, S1/S2, no murmurs  Respiratory: Clear to auscultation bilaterally, no wheezing, no crackles, no rhonchi  Abdomen: Soft, non tender, non distended, bowel sounds present  Extremities: No edema, pulses DP and PT palpable bilaterally  Neuro: Grossly nonfocal  Data Reviewed: Basic Metabolic Panel:  Recent Labs Lab 02/24/15 2204 02/24/15 2214 02/25/15 0736  NA 138 137 139  K 3.5 3.4* 3.3*  CL 105 101 107  CO2 24  --  23  GLUCOSE 134* 130* 147*   BUN 20 19 17   CREATININE 1.06* 1.00 1.05*  CALCIUM 9.4  --  8.7*  MG  --   --  1.8   Liver Function Tests:  Recent Labs Lab 02/24/15 2204 02/25/15 0736  AST 31 29  ALT 31 26  ALKPHOS 51 51  BILITOT 0.9 0.9  PROT 6.4* 5.8*  ALBUMIN 4.1 3.4*   No results for input(s): LIPASE, AMYLASE in the last 168 hours. No results for input(s): AMMONIA in the last 168 hours. CBC:  Recent Labs Lab 02/24/15 2204 02/24/15 2214 02/25/15 0736  WBC 12.2*  --  15.3*  NEUTROABS 10.2*  --   --   HGB 13.9 13.9 13.6  HCT 41.9 41.0 41.2  MCV 96.3  --  96.0  PLT 137*  --  108*   Cardiac Enzymes: No results for input(s): CKTOTAL, CKMB, CKMBINDEX, TROPONINI in the last 168 hours. BNP: Invalid input(s): POCBNP CBG: No results for input(s): GLUCAP in the last 168 hours.  Recent Results (from the past 240 hour(s))  Urine culture     Status: None (Preliminary result)   Collection Time: 02/24/15 10:44 PM  Result Value Ref Range Status   Specimen Description URINE, RANDOM  Final   Special Requests Immunocompromised  Final   Culture   Final    CULTURE REINCUBATED FOR BETTER GROWTH Performed at [facility]    Report Status PENDING  Incomplete  MRSA PCR Screening     Status: None   Collection Time: 02/25/15  2:11 AM  Result Value Ref Range Status   MRSA by PCR NEGATIVE NEGATIVE Final     Scheduled Meds: . cefTRIAXone (ROCEPHIN)  IV  1 g Intravenous Q24H  . sodium chloride  3 mL Intravenous Q12H   Continuous Infusions: . sodium chloride

## 2015-02-26 NOTE — Care Management Note (Signed)
Case Management Note  Patient Details  Name: Marcella DubsBetty F Dever MRN: [id number] Date of Birth: 1961-03-12  Subjective/Objective:  54 y/o f admitted w/UTI. Spoke to patient about d/c plans-Patient states she [place] in  [place](she stated the address on the face sheet.  [facility] tel#[phone]-she had not been there in a while.  pcp-appt set up for 11/15 @ 2;15p, [facility] [facility].  Has a pharmacy.  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 She states she will have own transp home.                 Action/Plan:d/c plan home.   Expected Discharge Date:  02/25/15               Expected Discharge Plan:  Home/Self Care  In-House Referral:     Discharge planning Services  CM Consult  Post Acute Care Choice:    Choice offered to:     DME Arranged:    DME Agency:     HH Arranged:    HH Agency:     Status of Service:  In process, will continue to follow  Medicare Important Message Given:    Date Medicare IM Given:    Medicare IM give by:    Date Additional Medicare IM Given:    Additional Medicare Important Message give by:     If discussed at Long Length of Stay Meetings, dates discussed:    Additional Comments:  Lanier ClamMahabir, Zhi Geier, RN 02/26/2015, 2:10 PM

## 2015-02-26 NOTE — Progress Notes (Signed)
Pt refused to take blankets off or allow RN to turn temperature down in room despite temperature of 100.1. Will continue to monitor pt closely. Mardene CelesteAsaro, Jaramie Bastos I

## 2015-02-27 DIAGNOSIS — F17200 Nicotine dependence, unspecified, uncomplicated: Secondary | ICD-10-CM

## 2015-02-27 MED ORDER — ALBUTEROL SULFATE (2.5 MG/3ML) 0.083% IN NEBU
2.5000 mg | INHALATION_SOLUTION | Freq: Four times a day (QID) | RESPIRATORY_TRACT | Status: DC | PRN
  Administered 2015-02-27 (×2): 2.5 mg via RESPIRATORY_TRACT
  Filled 2015-02-27 (×2): qty 3

## 2015-02-27 MED ORDER — LORAZEPAM 1 MG PO TABS
1.0000 mg | ORAL_TABLET | Freq: Four times a day (QID) | ORAL | Status: DC | PRN
  Administered 2015-02-27 – 2015-02-28 (×2): 1 mg via ORAL
  Filled 2015-02-27 (×3): qty 1

## 2015-02-27 MED ORDER — IBUPROFEN 200 MG PO TABS
200.0000 mg | ORAL_TABLET | ORAL | Status: DC | PRN
  Administered 2015-02-27 – 2015-02-28 (×3): 200 mg via ORAL
  Filled 2015-02-27 (×3): qty 1

## 2015-02-27 MED ORDER — CIPROFLOXACIN HCL 500 MG PO TABS
500.0000 mg | ORAL_TABLET | Freq: Two times a day (BID) | ORAL | Status: DC
  Administered 2015-02-27 – 2015-02-28 (×2): 500 mg via ORAL
  Filled 2015-02-27 (×2): qty 1

## 2015-02-27 MED ORDER — NICOTINE 7 MG/24HR TD PT24
7.0000 mg | MEDICATED_PATCH | Freq: Every day | TRANSDERMAL | Status: DC
  Administered 2015-02-27 (×2): 7 mg via TRANSDERMAL
  Filled 2015-02-27 (×2): qty 1

## 2015-02-27 NOTE — Progress Notes (Signed)
Pt asked multiple times during the shift if she could have a cigarette. Pt informed that she could not and pt verbalized that she was going to find some despite education. Pt also repeatedly asked if she could walk in the halls and where she could walk. Pt told that she could walk the hall only on the east side. Pt removed her IV and stated that she does not want another one until after breakfast.

## 2015-02-27 NOTE — Progress Notes (Signed)
Patient has continued to be paranoid and angry with staff. Patient has not let the RNs in the room the entire afternoon. Writer attempted to check on patient earlier and she slammed the door in writer's face and stated she "just wanted her privacy and to not come in the room." Patient then came out into the hallway just a little while later and told writer to come to her. As Clinical research associatewriter approached patient, she held up what appeared to be a necklace (from the distance) and stated "y'all better find my ruby that goes in this. It wasn't missing until you came in my room." Writer attempted to talk with patient and to look at the jewelry she was talking about, but patient slammed the door in writer's face again and stated to "stay out and that she wanted her privacy." Writer did not attempt to get any further information from patient due to her behavior. Writer was the staff member who did search the patient's room earlier for hospital equipment that has went missing from the room. Writer at that time found two patient belonging bags stuffed full of hospital linen. That linen was removed and since then, the patient has been angry with Clinical research associatewriter and paranoid. Will continue to monitor patient as she allows staff members to interact with her.   Arta BruceDeutsch, Whitman Meinhardt [facility] 02/27/2015 7:03 PM

## 2015-02-27 NOTE — Progress Notes (Signed)
Patient ID: [name], female   DOB: 1961-03-07, 54 y.o.   MRN: [id number] [facility] PROGRESS NOTE  JAELEEN INZUNZA [id number]:[id number] DOB: [birth year] DOA: 02/24/2015 PCP: Pcp Not In System will set up with [facility] on discharge   Brief narrative:    54 y.o. female with past medical history of nephrolithiasis, bipolar disorder, schizophrenia, emphysema, polysubstance abuse who presented to [facility] ED with reports of bilateral flank pain and tenderness in suprapubic region as well as associated fevers.patient was found to have UTI on the admission and was started on empiric Rocephin. Of note, she is currently homeless and recently was in prison in August 2016.  Anticipated discharge: 11/9.  Assessment/Plan:    Principal Problem:   Sepsis secondary to UTI ([place]) /  Acute pyelonephritis /  Leukocytosis - Sepsis criteria met with fever of 102.8 F, tachycardia, RR 22 and leukocytosis. Source of infection urinary tract infection, acute pyelonephritis - Continue Rocephin - Urine culture still pending as of 11/8   Active Problems:   Bilateral Nephrolithiasis - Seen on CT abdomen - No obstruction    Bipolar I disorder, most recent episode (or current) manic (HCC) - Stable    Hypokalemia - Likely secondary to sepsis and urinary tract infection - Supplemented     Thrombocytopenia (HCC) - Likely secondary to bone marrow suppression from alcohol abuse, polysubstance abuse - Platelets stable     Polysubstance abuse - UDS and alcohol not obtained on the admission     Emphysema/COPD ([place]) - Stable     Tobacco abuse disorder - Stable   DVT Prophylaxis  - SCD's bilaterally in hospital   Code Status: Full.  Family Communication:  plan of care discussed with the patient Disposition Plan: 02/28/2015.  IV access:  Peripheral IV  Procedures and diagnostic studies:    Ct Abdomen Pelvis W Contrast 02/25/2015   Bilateral nephrolithiasis. No acute findings are evident in the abdomen or  pelvis. Electronically Signed   By: Andreas Newport M.D.   On: 02/25/2015 00:07   Medical Consultants:  None   Other Consultants:  None   IAnti-Infectives:   Rocephin 02/25/2015 -->    Leisa Lenz, MD  [facility] Pager [phone]  Time spent in minutes: 15 minutes  If 7PM-7AM, please contact night-coverage [email or web address] Password [facility] 02/27/2015, 11:41 AM   LOS: 2 days    HPI/Subjective: No acute overnight events.  Objective: Filed Vitals:   02/25/15 2350 02/26/15 0623 02/26/15 2144 02/27/15 0556  BP: 131/58 117/70 115/65 111/71  Pulse: 106 107 100 94  Temp: 100.1 F (37.8 C) 99.5 F (37.5 C) 98.9 F (37.2 C) 98.6 F (37 C)  TempSrc: Oral Oral Oral Oral  Resp: 16 18 18 20   Height:      Weight:      SpO2: 96% 93% 98% 95%    Intake/Output Summary (Last 24 hours) at 02/27/15 1141 Last data filed at 02/26/15 2300  Gross per 24 hour  Intake     50 ml  Output    800 ml  Net   -750 ml    Exam:   General:  Pt is alert, not in acute distress  Cardiovascular: RRR, S1/S2 (+)  Respiratory: o wheezing, no crackles, no rhonchi  Abdomen: (+) BS, non tender  Extremities: No LE swelling, pulses palpable bilaterally  Neuro: onfocal  Data Reviewed: Basic Metabolic Panel:  Recent Labs Lab 02/24/15 2204 02/24/15 2214 02/25/15 0736 02/26/15 1125  NA 138 137 139 138  K 3.5 3.4*  3.3* 4.0  CL 105 101 107 109  CO2 24  --  23 26  GLUCOSE 134* 130* 147* 168*  BUN 20 19 17 13   CREATININE 1.06* 1.00 1.05* 0.84  CALCIUM 9.4  --  8.7* 8.8*  MG  --   --  1.8  --    Liver Function Tests:  Recent Labs Lab 02/24/15 2204 02/25/15 0736  AST 31 29  ALT 31 26  ALKPHOS 51 51  BILITOT 0.9 0.9  PROT 6.4* 5.8*  ALBUMIN 4.1 3.4*   No results for input(s): LIPASE, AMYLASE in the last 168 hours. No results for input(s): AMMONIA in the last 168 hours. CBC:  Recent Labs Lab 02/24/15 2204 02/24/15 2214 02/25/15 0736  WBC 12.2*  --  15.3*  NEUTROABS  10.2*  --   --   HGB 13.9 13.9 13.6  HCT 41.9 41.0 41.2  MCV 96.3  --  96.0  PLT 137*  --  108*   Cardiac Enzymes: No results for input(s): CKTOTAL, CKMB, CKMBINDEX, TROPONINI in the last 168 hours. BNP: Invalid input(s): POCBNP CBG: No results for input(s): GLUCAP in the last 168 hours.  Recent Results (from the past 240 hour(s))  Urine culture     Status: None (Preliminary result)   Collection Time: 02/24/15 10:44 PM  Result Value Ref Range Status   Specimen Description URINE, RANDOM  Final   Special Requests Immunocompromised  Final   Culture   Final    CULTURE REINCUBATED FOR BETTER GROWTH Performed at [facility]    Report Status PENDING  Incomplete  MRSA PCR Screening     Status: None   Collection Time: 02/25/15  2:11 AM  Result Value Ref Range Status   MRSA by PCR NEGATIVE NEGATIVE Final     Scheduled Meds: . cefTRIAXone (ROCEPHIN)  IV  1 g Intravenous Q24H  . nicotine  7 mg Transdermal Daily  . sodium chloride  3 mL Intravenous Q12H   Continuous Infusions:

## 2015-02-27 NOTE — Progress Notes (Signed)
Patient becoming more agitated and paranoid with staff. RN noticed that the medication scanner was missing from the room. Patient had two belongings bags stuffed full and tied shut. RN informed patient that she was searching for the scanner that was hospital property. It was then found that the patient had the two bags stuffed full of linen from the hospital and a bottle of Sani Wipes. Both bags were emptied and linen was removed from the room. There was one other towel in the patient's possession that was rolled up. When RN took it out of the drawer, the patient got loud and took it from her and stated that it was her "dildo" and laughed. She would not let the RN see that towel. The medication scanner is still missing. All drawers and cabinets searched, but it was not found.   Also, RN received an anonymous phone call from someone who the patient had called. She stated that the patient had called her and told her to "bring a carton of cigarettes and two large suitcases. The patient told the lady on the phone that she had a whole lot of good stuff, but that she would not be leaving the hospital until tomorrow and she needed to bring the suitcases to the hospital and take that stuff with her." The anonymous caller stated that she was concerned that the patient was referring to drugs. The caller was assured that the patient was not in any possession of drugs. RN suspects that the patient was referring to the bags of linen and other things from the room that she was attempting to take with her. The caller stated that we should check the mattress and anywhere that things could be hidden as she thinks the patient might be hiding things there. Staff will continue to assess the room and patient for safety.   Writer will pass on to other staff that the patient's belongings will need to be searched with Security present on day of discharge to ensure that the patient is not attempting to take any other hospital property.    Arta BruceDeutsch, Sharda Keddy [facility] 02/27/2015

## 2015-02-28 DIAGNOSIS — J438 Other emphysema: Secondary | ICD-10-CM

## 2015-02-28 LAB — URINE CULTURE: Culture: >=100000

## 2015-02-28 MED ORDER — CIPROFLOXACIN HCL 500 MG PO TABS: 500.0000 mg | ORAL_TABLET | Freq: Two times a day (BID) | ORAL | Status: DC

## 2015-02-28 NOTE — Discharge Instructions (Signed)

## 2015-02-28 NOTE — Discharge Summary (Signed)
Physician Discharge Summary  [name] JJH:[id number] DOB: 28-Sep-1960 DOA: 02/24/2015  PCP: Pcp Not In System appt sch with P[facility]   Admit date: 02/24/2015 Discharge date: 02/28/2015  Recommendations for Outpatient Follow-up:  1. Continue Cipro for 5 days on discharge. This is for treatment of Escherichia coli UTI.  Discharge Diagnoses:  Principal Problem:   Sepsis secondary to UTI [facility]) Active Problems:   Acute pyelonephritis   Bilateral Nephrolithiasis   Leukocytosis   Bipolar I disorder, most recent episode (or current) manic ([place])   Essential hypertension   Polysubstance abuse   Emphysema/COPD (HCC)   Tobacco abuse disorder   Hypokalemia   Thrombocytopenia (HCC)    Discharge Condition: stable   Diet recommendation: as tolerated   History of present illness:  54 y.o. female with past medical history of nephrolithiasis, bipolar disorder, schizophrenia, emphysema, polysubstance abuse who presented to [facility] ED with reports of bilateral flank pain and tenderness in suprapubic region as well as associated fevers.patient was found to have UTI on the admission and was started on empiric Rocephin. Of note, she is currently homeless and recently was in prison in August 2016.  Hospital Course:    Assessment/Plan:    Principal Problem:  Sepsis secondary to Escherichia coli UTI (HCC) / Acute pyelonephritis / Leukocytosis - Sepsis criteria met with fever of 102.8 F, tachycardia, RR 22 and leukocytosis. Source of infection urinary tract infection, acute pyelonephritis - Urine culture grew Escherichia coli - Patient was started on Rocephin since the admission. Because of intermittent agitation she pulled IV lines so last dose of Rocephin was 02/27/2015. We changed to ciprofloxacin starting 02/27/2015 - Patient will continue Rocephin for 5 days on discharge.  Active Problems:  Bilateral Nephrolithiasis - Seen on CT abdomen - No obstruction   Bipolar I  disorder, most recent episode (or current) manic (HCC) - Stable   Hypokalemia - Likely secondary to sepsis and urinary tract infection - Supplemented    Thrombocytopenia (HCC) - Likely secondary to bone marrow suppression from alcohol abuse, polysubstance abuse - Platelets stable    Polysubstance abuse - UDS and alcohol not obtained on the admission    Emphysema/COPD ([place]) - Stable  - Not in exacerbation    Tobacco abuse disorder - Stable   DVT Prophylaxis  - SCD's bilaterally in hospital   Code Status: Full.  Family Communication: plan of care discussed with the patient   IV access:  Peripheral IV  Procedures and diagnostic studies:   Ct Abdomen Pelvis W Contrast 02/25/2015 Bilateral nephrolithiasis. No acute findings are evident in the abdomen or pelvis. Electronically Signed By: Andreas Newport M.D. On: 02/25/2015 00:07   Medical Consultants:  None   Other Consultants:  None   IAnti-Infectives:   Rocephin 02/25/2015 --> 02/27/2015   Signed:  Leisa Lenz, MD  [facility] 02/28/2015, 10:01 AM  Pager #: [phone]  Time spent in minutes: less than 30 minutes  Discharge Exam: Filed Vitals:   02/27/15 1419  BP: 129/92  Pulse: 120  Temp: 98.1 F (36.7 C)  Resp: 20   Filed Vitals:   02/26/15 0623 02/26/15 2144 02/27/15 0556 02/27/15 1419  BP: 117/70 115/65 111/71 129/92  Pulse: 107 100 94 120  Temp: 99.5 F (37.5 C) 98.9 F (37.2 C) 98.6 F (37 C) 98.1 F (36.7 C)  TempSrc: Oral Oral Oral Oral  Resp: 18 18 20 20   Height:      Weight:      SpO2: 93% 98%  95% 96%    General: Pt is alert, follows commands appropriately, not in acute distress Cardiovascular: Regular rate and rhythm, S1/S2 +, no murmurs Respiratory: Clear to auscultation bilaterally, no wheezing, no crackles, no rhonchi Abdominal: Soft, non tender, non distended, bowel sounds +, no guarding Extremities: no edema, no cyanosis, pulses  palpable bilaterally DP and PT Neuro: Grossly nonfocal  Discharge Instructions  Discharge Instructions    Call MD for:  difficulty breathing, headache or visual disturbances    Complete by:  As directed      Call MD for:  persistant dizziness or light-headedness    Complete by:  As directed      Call MD for:  persistant nausea and vomiting    Complete by:  As directed      Call MD for:  severe uncontrolled pain    Complete by:  As directed      Diet - low sodium heart healthy    Complete by:  As directed      Discharge instructions    Complete by:  As directed   Take cipro for E.Coli UTI for 5 days on discharge.     Increase activity slowly    Complete by:  As directed             Medication List    TAKE these medications        ciprofloxacin 500 MG tablet  Commonly known as:  CIPRO  Take 1 tablet (500 mg total) by mouth 2 (two) times daily.           Follow-up Information    Follow up On 03/06/2015.   Why:  @ 2:15p   Contact information:   [place] [phone]       The results of significant diagnostics from this hospitalization (including imaging, microbiology, ancillary and laboratory) are listed below for reference.    Significant Diagnostic Studies: Ct Abdomen Pelvis W Contrast  02/25/2015  CLINICAL DATA:  Bilateral flank pain. Blood-streaked stool. Three days duration. EXAM: CT ABDOMEN AND PELVIS WITH CONTRAST TECHNIQUE: Multidetector CT imaging of the abdomen and pelvis was performed using the standard protocol following bolus administration of intravenous contrast. CONTRAST:  3m OMNIPAQUE IOHEXOL 300 MG/ML SOLN, 1041mOMNIPAQUE IOHEXOL 300 MG/ML SOLN COMPARISON:  04/04/2012 FINDINGS: Lower chest:  No significant abnormality Hepatobiliary: There are normal appearances of the liver, gallbladder and bile ducts. Pancreas: Normal Spleen: Normal Adrenals/Urinary Tract: There are collecting system calculi and both  kidneys, measuring up to 3 mm. No ureteral calculi. No hydronephrosis. Adrenals are normal. Unremarkable urinary bladder. Stomach/Bowel: The stomach, small bowel and colon appear unremarkable. Vascular/Lymphatic: The abdominal aorta is normal in caliber. There is mild atherosclerotic calcification. There is no adenopathy in the abdomen or pelvis. Reproductive: Hysterectomy.  No adnexal abnormalities. Other: No acute inflammatory changes are evident in the abdomen or pelvis. There is no ascites. Musculoskeletal: No significant abnormality. IMPRESSION: Bilateral nephrolithiasis. No acute findings are evident in the abdomen or pelvis. Electronically Signed   By: DaAndreas Newport.D.   On: 02/25/2015 00:07    Microbiology: Recent Results (from the past 240 hour(s))  Urine culture     Status: None   Collection Time: 02/24/15 10:44 PM  Result Value Ref Range Status   Specimen Description URINE, RANDOM  Final   Special Requests Immunocompromised  Final   Culture   Final    >=100,000 COLONIES/mL ESCHERICHIA COLI Performed at [facility]  Report Status 02/28/2015 FINAL  Final   Organism ID, Bacteria ESCHERICHIA COLI  Final      Susceptibility   Escherichia coli - MIC*    AMPICILLIN <=2 SENSITIVE Sensitive     CEFAZOLIN <=4 SENSITIVE Sensitive     CEFTRIAXONE <=1 SENSITIVE Sensitive     CIPROFLOXACIN <=0.25 SENSITIVE Sensitive     GENTAMICIN <=1 SENSITIVE Sensitive     IMIPENEM <=0.25 SENSITIVE Sensitive     NITROFURANTOIN <=16 SENSITIVE Sensitive     TRIMETH/SULFA <=20 SENSITIVE Sensitive     AMPICILLIN/SULBACTAM <=2 SENSITIVE Sensitive     PIP/TAZO <=4 SENSITIVE Sensitive     * >=100,000 COLONIES/mL ESCHERICHIA COLI  MRSA PCR Screening     Status: None   Collection Time: 02/25/15  2:11 AM  Result Value Ref Range Status   MRSA by PCR NEGATIVE NEGATIVE Final    Comment:        The GeneXpert MRSA Assay (FDA approved for NASAL specimens only), is one component of a comprehensive  MRSA colonization surveillance program. It is not intended to diagnose MRSA infection nor to guide or monitor treatment for MRSA infections.      Labs: Basic Metabolic Panel:  Recent Labs Lab 02/24/15 2204 02/24/15 2214 02/25/15 0736 02/26/15 1125  NA 138 137 139 138  K 3.5 3.4* 3.3* 4.0  CL 105 101 107 109  CO2 24  --  23 26  GLUCOSE 134* 130* 147* 168*  BUN 20 19 17 13   CREATININE 1.06* 1.00 1.05* 0.84  CALCIUM 9.4  --  8.7* 8.8*  MG  --   --  1.8  --    Liver Function Tests:  Recent Labs Lab 02/24/15 2204 02/25/15 0736  AST 31 29  ALT 31 26  ALKPHOS 51 51  BILITOT 0.9 0.9  PROT 6.4* 5.8*  ALBUMIN 4.1 3.4*   No results for input(s): LIPASE, AMYLASE in the last 168 hours. No results for input(s): AMMONIA in the last 168 hours. CBC:  Recent Labs Lab 02/24/15 2204 02/24/15 2214 02/25/15 0736  WBC 12.2*  --  15.3*  NEUTROABS 10.2*  --   --   HGB 13.9 13.9 13.6  HCT 41.9 41.0 41.2  MCV 96.3  --  96.0  PLT 137*  --  108*   Cardiac Enzymes: No results for input(s): CKTOTAL, CKMB, CKMBINDEX, TROPONINI in the last 168 hours. BNP: BNP (last 3 results) No results for input(s): BNP in the last 8760 hours.  ProBNP (last 3 results) No results for input(s): PROBNP in the last 8760 hours.  CBG: No results for input(s): GLUCAP in the last 168 hours.

## 2015-03-22 ENCOUNTER — Emergency Department (HOSPITAL_COMMUNITY)
Admission: EM | Admit: 2015-03-22 | Discharge: 2015-03-22 | Disposition: A | Discharge status: Home / Self Care | Payer: Medicaid Other | Attending: Emergency Medicine | Admitting: Emergency Medicine

## 2015-03-22 ENCOUNTER — Emergency Department (HOSPITAL_COMMUNITY)
Admission: EM | Admit: 2015-03-22 | Discharge: 2015-03-22 | Disposition: A | Discharge status: Home / Self Care | Payer: Medicaid Other | Attending: Physician Assistant | Admitting: Physician Assistant

## 2015-03-22 ENCOUNTER — Encounter (HOSPITAL_COMMUNITY): Payer: Self-pay | Admitting: Emergency Medicine

## 2015-03-22 ENCOUNTER — Emergency Department (HOSPITAL_COMMUNITY): Payer: Medicaid Other

## 2015-03-22 DIAGNOSIS — S20212D Contusion of left front wall of thorax, subsequent encounter: Secondary | ICD-10-CM | POA: Insufficient documentation

## 2015-03-22 DIAGNOSIS — F17210 Nicotine dependence, cigarettes, uncomplicated: Secondary | ICD-10-CM | POA: Insufficient documentation

## 2015-03-22 DIAGNOSIS — Z8739 Personal history of other diseases of the musculoskeletal system and connective tissue: Secondary | ICD-10-CM | POA: Insufficient documentation

## 2015-03-22 DIAGNOSIS — Y9389 Activity, other specified: Secondary | ICD-10-CM | POA: Insufficient documentation

## 2015-03-22 DIAGNOSIS — R079 Chest pain, unspecified: Secondary | ICD-10-CM

## 2015-03-22 DIAGNOSIS — J449 Chronic obstructive pulmonary disease, unspecified: Secondary | ICD-10-CM | POA: Insufficient documentation

## 2015-03-22 DIAGNOSIS — F911 Conduct disorder, childhood-onset type: Secondary | ICD-10-CM | POA: Insufficient documentation

## 2015-03-22 DIAGNOSIS — Z792 Long term (current) use of antibiotics: Secondary | ICD-10-CM | POA: Insufficient documentation

## 2015-03-22 DIAGNOSIS — F319 Bipolar disorder, unspecified: Secondary | ICD-10-CM | POA: Insufficient documentation

## 2015-03-22 DIAGNOSIS — S20212A Contusion of left front wall of thorax, initial encounter: Secondary | ICD-10-CM | POA: Insufficient documentation

## 2015-03-22 DIAGNOSIS — Y92149 Unspecified place in prison as the place of occurrence of the external cause: Secondary | ICD-10-CM | POA: Insufficient documentation

## 2015-03-22 DIAGNOSIS — Z8659 Personal history of other mental and behavioral disorders: Secondary | ICD-10-CM | POA: Insufficient documentation

## 2015-03-22 DIAGNOSIS — R05 Cough: Secondary | ICD-10-CM | POA: Insufficient documentation

## 2015-03-22 DIAGNOSIS — Y998 Other external cause status: Secondary | ICD-10-CM | POA: Insufficient documentation

## 2015-03-22 DIAGNOSIS — Z79899 Other long term (current) drug therapy: Secondary | ICD-10-CM | POA: Insufficient documentation

## 2015-03-22 HISTORY — DX: Bipolar disorder, unspecified: F31.9

## 2015-03-22 HISTORY — DX: Unspecified osteoarthritis, unspecified site: M19.90

## 2015-03-22 HISTORY — DX: Chronic obstructive pulmonary disease, unspecified: J44.9

## 2015-03-22 MED ORDER — TRAMADOL HCL 50 MG PO TABS: 50.0000 mg | ORAL_TABLET | Freq: Four times a day (QID) | ORAL | Status: DC | PRN

## 2015-03-22 MED ORDER — IBUPROFEN 800 MG PO TABS: 800.0000 mg | ORAL_TABLET | Freq: Three times a day (TID) | ORAL | Status: DC

## 2015-03-22 MED ORDER — IBUPROFEN 800 MG PO TABS
800.0000 mg | ORAL_TABLET | Freq: Once | ORAL | Status: AC
  Administered 2015-03-22: 800 mg via ORAL
  Filled 2015-03-22: qty 1

## 2015-03-22 MED ORDER — SODIUM CHLORIDE 0.9 % IV BOLUS (SEPSIS)
1000.0000 mL | Freq: Once | INTRAVENOUS | Status: AC
  Administered 2015-03-22: 1000 mL via INTRAVENOUS

## 2015-03-22 NOTE — ED Notes (Signed)
Pt noted to be sleeping soundly at this time.  
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
## 2015-03-22 NOTE — Progress Notes (Signed)
  CM provided written information for uninsured accepting pcps, discussed the importance of pcp vs EDP services for f/u care, www.needymeds.org, www.goodrx.com, discounted pharmacies and other [place] resources such as [facility] , [facility], affordable care act, financial assistance, uninsured dental services, [place] med assist, DSS and  health department  [place] resources for [place] uninsured accepting pcps like [facility], family medicine at [address], [facility], [facility], local urgent care centers, [facility], [facility], general medical clinics, family services of the [place], [facility] urgent care plus others, medication resources, [facility] out patient pharmacies and housing   Provided [facility] contact information  While Cm was in the pt room she was eating a tray of food the nursing staff offered to her from a pt that had left Pt demanding another food tray Pt stating "i'm returning back in here"  Pt voiced being upset because "she is giving me Ibuprofen. I'm not a pill popper"  "I'm in pain" as she is eating and talking at the same time.  States she has "not eaten all night"  "I am going to stay even if it is one night"  Pt requesting " I want to speak with the head of the hospital"

## 2015-03-22 NOTE — ED Notes (Signed)
Pt assisted by EDT to get dressed, supplies given for personal hygiene per request.
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
## 2015-03-22 NOTE — Discharge Instructions (Signed)
You were seen today for chest pain. You had one nondisplaced rib fracture. Please be sure to take deep breaths at home.

## 2015-03-22 NOTE — ED Notes (Signed)
Discharge papers provided to pt. Pt snoring after Clinical research associatewriter completes a sentence. VSS. Will speak with CN about discharge.
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
## 2015-03-22 NOTE — Discharge Instructions (Signed)
Chest Wall Pain Ms. Perlie GoldRussell, your xray is negative for broken ribs and pneumonia.  Take ibuprofen as needed for pain control.  See a primary care doctor within 3 days for close follow up.  If symptoms worsen, come back to the ED immediately.  Thank you. Chest wall pain is pain in or around the bones and muscles of your chest. Sometimes, an injury causes this pain. Sometimes, the cause may not be known. This pain may take several weeks or longer to get better. HOME CARE Pay attention to any changes in your symptoms. Take these actions to help with your pain:  Rest as told by your doctor.  Avoid activities that cause pain. Try not to use your chest, belly (abdominal), or side muscles to lift heavy things.  If directed, apply ice to the painful area:  Put ice in a plastic bag.  Place a towel between your skin and the bag.  Leave the ice on for 20 minutes, 2-3 times per day.  Take over-the-counter and prescription medicines only as told by your doctor.  Do not use tobacco products, including cigarettes, chewing tobacco, and e-cigarettes. If you need help quitting, ask your doctor.  Keep all follow-up visits as told by your doctor. This is important. GET HELP IF:  You have a fever.  Your chest pain gets worse.  You have new symptoms. GET HELP RIGHT AWAY IF:  You feel sick to your stomach (nauseous) or you throw up (vomit).  You feel sweaty or light-headed.  You have a cough with phlegm (sputum) or you cough up blood.  You are short of breath.   This information is not intended to replace advice given to you by your health care provider. Make sure you discuss any questions you have with your health care provider.   Document Released: 09/24/2007 Document Revised: 12/27/2014 Document Reviewed: 07/03/2014 Elsevier Interactive Patient Education [employer]2016 Elsevier Inc.

## 2015-03-22 NOTE — ED Notes (Signed)
AC at bedside speaking with pt.  

## 2015-03-22 NOTE — ED Notes (Signed)
CN and Clinical research associatewriter spoke with pt, reports that he ride is en route. Awaiting ride.
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
## 2017-11-11 ENCOUNTER — Other Ambulatory Visit: Payer: Self-pay | Admitting: General Surgery

## 2017-11-27 ENCOUNTER — Encounter (HOSPITAL_BASED_OUTPATIENT_CLINIC_OR_DEPARTMENT_OTHER): Payer: Self-pay | Admitting: *Deleted

## 2017-11-27 ENCOUNTER — Other Ambulatory Visit: Payer: Self-pay

## 2017-11-28 NOTE — H&P (Signed)
[name] Location: [facility] Patient #: [id number] DOB: [birth year] Undefined / Language: [place] / Race: White Female      History of Present Illness       The patient is a 57 year old female who presents with a complaint of chronic infected cyst left parasternal skin. This is a 57 year old female who has been at the [facility] as an inmate for at least 2 years. 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 The patient relates that this sometimes swells up and gets red and drains and then settles down and always drains with a foul odor. This been going on 2 years. She's been on antibiotics at least twice but it persists. [facility] of this [facility] on July [facility], 2017. They describe a 2.3 cm cyst with internal debris but no septation or thickening of the cyst wall was noted. This was noted in the left parasternal area, 11:00 area of the left breast. Felt to be category 3 probably benign. Read by a Dr. Stacie Glaze. Comorbidities include obesity, hyperlipidemia, bipolar disorder, borderline hypertension. Ongoing tobacco abuse.     Exam reveals draining wound in the left parasternal area. There is not much mass and there is not any erythema. Total compression there were some foul-smelling minimally cloudy fluid that came out. Suspect sebaceous cyst.     I discussed options with her. I discussed observation only, and further antibiotics, or excision with open packing. She would like to have excision with open packing and hope that this would heal by secondary intention and that she was stopped having foul-smelling drainage. That is reasonable She'll be scheduled for [facility] of this [facility] near future I discussed the indications, details, techniques, and risk of the surgery in detail. She is aware of the risk of nerve  damage, chronic pain, recurrent infection, bleeding. She understands all of this. All questions are answered. She agrees with this plan.     Tresa Endo served as my Biomedical engineer during the encounter as well as a female Biochemist, clinical.   Past Surgical History  Appendectomy  Hysterectomy (not due to cancer) - Complete  Knee Surgery  Left.  Diagnostic Studies History Colonoscopy  never Pap Smear  >5 years ago  Allergies  No Known Allergies   Medication History  Lithium Carbonate (300MG  Capsule, Oral) Active.  Social History Alcohol use  Moderate alcohol use. Caffeine use  Coffee, Tea. Illicit drug use  Uses monthly. Tobacco use  Current every day smoker.  Family History Alcohol Abuse  Brother, Family Members In [place], Father, Sister. Arthritis  Father, Mother. Bleeding disorder  Mother. Cancer  Father. Depression  Brother, Sister. Diabetes Mellitus  Mother. Heart Disease  Mother. Heart disease in female family member before age 58  Heart disease in female family member before age 18  Hypertension  Brother, Father, Mother. Ischemic Bowel Disease  Mother, Sister. Kidney Disease  Mother. Melanoma  Brother, Father. Migraine Headache  Sister. Ovarian Cancer  Family Members In General. Respiratory Condition  Mother. Seizure disorder  Brother.  Pregnancy / Birth History ) Age at menarche  10 years. Age of menopause  <43 Gravida  0 Para  0  Other Problems  Alcohol Abuse  Arthritis  Asthma  Back Pain  Bladder Problems  Chest pain  Chronic Obstructive Lung Disease  Depression  Gastroesophageal Reflux Disease  Hemorrhoids  Hypercholesterolemia  Lump In Breast  Oophorectomy  Bilateral. Sleep Apnea  Thyroid Disease  Vascular Disease     Review of Systems  General Present- Fatigue, Night Sweats and Weight Gain. Not Present- Appetite Loss, Chills, Fever and Weight Loss. Skin Present- Change in Wart/Mole, Dryness,  Non-Healing Wounds and Rash. Not Present- Hives, Jaundice, New Lesions and Ulcer. HEENT Present- Hearing Loss, Seasonal Allergies, Sinus Pain, Visual Disturbances and Wears glasses/contact lenses. Not Present- Earache, Hoarseness, Nose Bleed, Oral Ulcers, Ringing in the Ears, Sore Throat and Yellow Eyes. Respiratory Present- Difficulty Breathing and Snoring. Not Present- Bloody sputum, Chronic Cough and Wheezing. Breast Not Present- Breast Mass, Breast Pain, Nipple Discharge and Skin Changes. Cardiovascular Present- Shortness of Breath and Swelling of Extremities. Not Present- Chest Pain, Difficulty Breathing Lying Down, Leg Cramps, Palpitations and Rapid Heart Rate. Gastrointestinal Present- Bloating, Bloody Stool, Change in Bowel Habits, Constipation, Gets full quickly at meals, Hemorrhoids, Indigestion and Rectal Pain. Not Present- Abdominal Pain, Chronic diarrhea, Difficulty Swallowing, Excessive gas, Nausea and Vomiting. Female Genitourinary Present- Frequency, Nocturia, Pelvic Pain and Urgency. Not Present- Painful Urination. Musculoskeletal Present- Back Pain, Joint Pain, Joint Stiffness, Muscle Pain, Muscle Weakness and Swelling of Extremities. Neurological Present- Decreased Memory, Tingling, Trouble walking and Weakness. Not Present- Fainting, Headaches, Numbness, Seizures and Tremor. Psychiatric Present- Bipolar, Change in Sleep Pattern and Depression. Not Present- Anxiety, Fearful and Frequent crying. Endocrine Present- Excessive Hunger, Hair Changes, Heat Intolerance and Hot flashes. Not Present- Cold Intolerance and New Diabetes. Hematology Present- Easy Bruising and Persistent Infections. Not Present- Blood Thinners, Excessive bleeding, Gland problems and HIV.  Vitals  Weight: 128 lb Height: 69in Weight was reported by patient. Height was reported by patient. Body Surface Area: 1.71 m Body Mass Index: 18.9 kg/m  BP: 120/82 (Sitting, Right Arm, Standard)    Physical  Exam General Mental Status-Alert. General Appearance-Not in acute distress. Build & Nutrition-Well nourished. Posture-Normal posture. Gait-Normal. Note: BMI 33   Integumentary Note: Left parasternal area above the breast there is a area of drainage. Slightly cloudy clear noncolored fluid with foul odor suggesting chronic sebaceous material. Laterally feel much of a mass. There is no erythema. This appears to be a skin problem, not a breast problem   Head and Neck Head-normocephalic, atraumatic with no lesions or palpable masses. Trachea-midline. Thyroid Gland Characteristics - normal size and consistency and no palpable nodules.  Chest and Lung Exam Chest and lung exam reveals -on auscultation, normal breath sounds, no adventitious sounds and normal vocal resonance.  Breast Note: Breasts are large. No other skin problems. No palpable mass.   Cardiovascular Cardiovascular examination reveals -normal heart sounds, regular rate and rhythm with no murmurs and femoral artery auscultation bilaterally reveals normal pulses, no bruits, no thrills.  Abdomen Inspection Inspection of the abdomen reveals - No Hernias. Palpation/Percussion Palpation and Percussion of the abdomen reveal - Soft, Non Tender, No Rigidity (guarding), No hepatosplenomegaly and No Palpable abdominal masses.  Neurologic Neurologic evaluation reveals -alert and oriented x 3 with no impairment of recent or remote memory, normal attention span and ability to concentrate, normal sensation and normal coordination.  Musculoskeletal Normal Exam - Bilateral-Upper Extremity Strength Normal and Lower Extremity Strength Normal.    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 Derrell Lolling, M.D., [facility] General and Minimally invasive Surgery Breast and Colorectal Surgery Office:   [phone] Pager:   [phone]

## 2017-12-04 ENCOUNTER — Ambulatory Visit (HOSPITAL_BASED_OUTPATIENT_CLINIC_OR_DEPARTMENT_OTHER): Admitting: Anesthesiology

## 2017-12-04 ENCOUNTER — Encounter (HOSPITAL_BASED_OUTPATIENT_CLINIC_OR_DEPARTMENT_OTHER)
Admission: RE | Disposition: A | Discharge status: Home / Self Care | Payer: Self-pay | Source: Ambulatory Visit | Attending: General Surgery

## 2017-12-04 ENCOUNTER — Encounter (HOSPITAL_BASED_OUTPATIENT_CLINIC_OR_DEPARTMENT_OTHER): Payer: Self-pay | Admitting: *Deleted

## 2017-12-04 ENCOUNTER — Ambulatory Visit (HOSPITAL_BASED_OUTPATIENT_CLINIC_OR_DEPARTMENT_OTHER)
Admission: RE | Admit: 2017-12-04 | Discharge: 2017-12-04 | Disposition: A | Discharge status: Home / Self Care | Source: Ambulatory Visit | Attending: General Surgery | Admitting: General Surgery

## 2017-12-04 ENCOUNTER — Other Ambulatory Visit: Payer: Self-pay

## 2017-12-04 DIAGNOSIS — J449 Chronic obstructive pulmonary disease, unspecified: Secondary | ICD-10-CM | POA: Insufficient documentation

## 2017-12-04 DIAGNOSIS — Z87891 Personal history of nicotine dependence: Secondary | ICD-10-CM | POA: Insufficient documentation

## 2017-12-04 DIAGNOSIS — L720 Epidermal cyst: Secondary | ICD-10-CM | POA: Diagnosis present

## 2017-12-04 DIAGNOSIS — L723 Sebaceous cyst: Secondary | ICD-10-CM | POA: Diagnosis not present

## 2017-12-04 DIAGNOSIS — Z79899 Other long term (current) drug therapy: Secondary | ICD-10-CM | POA: Insufficient documentation

## 2017-12-04 DIAGNOSIS — F319 Bipolar disorder, unspecified: Secondary | ICD-10-CM | POA: Diagnosis not present

## 2017-12-04 DIAGNOSIS — E785 Hyperlipidemia, unspecified: Secondary | ICD-10-CM | POA: Diagnosis not present

## 2017-12-04 DIAGNOSIS — I10 Essential (primary) hypertension: Secondary | ICD-10-CM | POA: Diagnosis not present

## 2017-12-04 HISTORY — PX: MASS EXCISION: SHX2000

## 2017-12-04 SURGERY — EXCISION MASS
Anesthesia: General | Site: Chest | Laterality: Left

## 2017-12-04 MED ORDER — LIDOCAINE 2% (20 MG/ML) 5 ML SYRINGE
INTRAMUSCULAR | Status: AC
  Filled 2017-12-04: qty 5

## 2017-12-04 MED ORDER — LACTATED RINGERS IV SOLN
INTRAVENOUS | Status: DC
  Administered 2017-12-04: 09:00:00 via INTRAVENOUS

## 2017-12-04 MED ORDER — PROPOFOL 10 MG/ML IV BOLUS
INTRAVENOUS | Status: DC | PRN
  Administered 2017-12-04: 200 mg via INTRAVENOUS

## 2017-12-04 MED ORDER — METOCLOPRAMIDE HCL 5 MG/ML IJ SOLN: 10.0000 mg | Freq: Once | INTRAMUSCULAR | Status: DC | PRN

## 2017-12-04 MED ORDER — MIDAZOLAM HCL 5 MG/5ML IJ SOLN
INTRAMUSCULAR | Status: DC | PRN
  Administered 2017-12-04: 2 mg via INTRAVENOUS

## 2017-12-04 MED ORDER — LIDOCAINE 2% (20 MG/ML) 5 ML SYRINGE
INTRAMUSCULAR | Status: DC | PRN
  Administered 2017-12-04: 100 mg via INTRAVENOUS

## 2017-12-04 MED ORDER — DEXAMETHASONE SODIUM PHOSPHATE 10 MG/ML IJ SOLN
INTRAMUSCULAR | Status: AC
  Filled 2017-12-04: qty 1

## 2017-12-04 MED ORDER — BUPIVACAINE-EPINEPHRINE 0.25% -1:200000 IJ SOLN
INTRAMUSCULAR | Status: DC | PRN
  Administered 2017-12-04: 10 mL

## 2017-12-04 MED ORDER — PROPOFOL 10 MG/ML IV BOLUS
INTRAVENOUS | Status: AC
  Filled 2017-12-04: qty 20

## 2017-12-04 MED ORDER — ONDANSETRON HCL 4 MG/2ML IJ SOLN
INTRAMUSCULAR | Status: DC | PRN
  Administered 2017-12-04: 4 mg via INTRAVENOUS

## 2017-12-04 MED ORDER — DEXAMETHASONE SODIUM PHOSPHATE 10 MG/ML IJ SOLN
INTRAMUSCULAR | Status: DC | PRN
  Administered 2017-12-04: 10 mg via INTRAVENOUS

## 2017-12-04 MED ORDER — MIDAZOLAM HCL 2 MG/2ML IJ SOLN: 1.0000 mg | INTRAMUSCULAR | Status: DC | PRN

## 2017-12-04 MED ORDER — CEFAZOLIN SODIUM-DEXTROSE 2-3 GM-%(50ML) IV SOLR
INTRAVENOUS | Status: DC | PRN
  Administered 2017-12-04: 2 g via INTRAVENOUS

## 2017-12-04 MED ORDER — FENTANYL CITRATE (PF) 100 MCG/2ML IJ SOLN: 25.0000 ug | INTRAMUSCULAR | Status: DC | PRN

## 2017-12-04 MED ORDER — FENTANYL CITRATE (PF) 100 MCG/2ML IJ SOLN
INTRAMUSCULAR | Status: DC | PRN
  Administered 2017-12-04: 25 ug via INTRAVENOUS
  Administered 2017-12-04: 50 ug via INTRAVENOUS
  Administered 2017-12-04: 25 ug via INTRAVENOUS

## 2017-12-04 MED ORDER — LACTATED RINGERS IV SOLN: INTRAVENOUS | Status: DC

## 2017-12-04 MED ORDER — MIDAZOLAM HCL 2 MG/2ML IJ SOLN
INTRAMUSCULAR | Status: AC
  Filled 2017-12-04: qty 2

## 2017-12-04 MED ORDER — CHLORHEXIDINE GLUCONATE CLOTH 2 % EX PADS: 6.0000 | MEDICATED_PAD | Freq: Once | CUTANEOUS | Status: DC

## 2017-12-04 MED ORDER — FENTANYL CITRATE (PF) 100 MCG/2ML IJ SOLN: 50.0000 ug | INTRAMUSCULAR | Status: DC | PRN

## 2017-12-04 MED ORDER — ONDANSETRON HCL 4 MG/2ML IJ SOLN
INTRAMUSCULAR | Status: AC
  Filled 2017-12-04: qty 2

## 2017-12-04 MED ORDER — FENTANYL CITRATE (PF) 100 MCG/2ML IJ SOLN
INTRAMUSCULAR | Status: AC
  Filled 2017-12-04: qty 2

## 2017-12-04 MED ORDER — MEPERIDINE HCL 25 MG/ML IJ SOLN: 6.2500 mg | INTRAMUSCULAR | Status: DC | PRN

## 2017-12-04 MED ORDER — SCOPOLAMINE 1 MG/3DAYS TD PT72: 1.0000 | MEDICATED_PATCH | Freq: Once | TRANSDERMAL | Status: DC | PRN

## 2017-12-04 SURGICAL SUPPLY — 59 items
BANDAGE ACE 6X5 VEL STRL LF (GAUZE/BANDAGES/DRESSINGS) IMPLANT
BENZOIN TINCTURE PRP APPL 2/3 (GAUZE/BANDAGES/DRESSINGS) IMPLANT
BLADE HEX COATED 2.75 (ELECTRODE) ×3 IMPLANT
BLADE SURG 15 STRL LF DISP TIS (BLADE) ×2 IMPLANT
BLADE SURG 15 STRL SS (BLADE) ×4
CANISTER SUCT 1200ML W/VALVE (MISCELLANEOUS) ×3 IMPLANT
CHLORAPREP W/TINT 26ML (MISCELLANEOUS) ×3 IMPLANT
CLOSURE WOUND 1/2 X4 (GAUZE/BANDAGES/DRESSINGS)
COVER BACK TABLE 60X90IN (DRAPES) ×3 IMPLANT
COVER MAYO STAND STRL (DRAPES) ×3 IMPLANT
DECANTER SPIKE VIAL GLASS SM (MISCELLANEOUS) IMPLANT
DERMABOND ADVANCED (GAUZE/BANDAGES/DRESSINGS)
DERMABOND ADVANCED .7 DNX12 (GAUZE/BANDAGES/DRESSINGS) IMPLANT
DRAPE LAPAROTOMY 100X72 PEDS (DRAPES) ×3 IMPLANT
DRAPE LAPAROTOMY TRNSV 102X78 (DRAPE) IMPLANT
DRAPE UTILITY XL STRL (DRAPES) ×3 IMPLANT
ELECT REM PT RETURN 9FT ADLT (ELECTROSURGICAL) ×3
ELECTRODE REM PT RTRN 9FT ADLT (ELECTROSURGICAL) ×1 IMPLANT
GAUZE 4X4 16PLY RFD (DISPOSABLE) IMPLANT
GAUZE SPONGE 4X4 12PLY STRL LF (GAUZE/BANDAGES/DRESSINGS) IMPLANT
GLOVE BIO SURGEON STRL SZ 6.5 (GLOVE) ×2 IMPLANT
GLOVE BIO SURGEONS STRL SZ 6.5 (GLOVE) ×1
GLOVE BIOGEL PI IND STRL 8 (GLOVE) ×1 IMPLANT
GLOVE BIOGEL PI INDICATOR 8 (GLOVE) ×2
GLOVE EUDERMIC 7 POWDERFREE (GLOVE) ×3 IMPLANT
GOWN STRL REUS W/ TWL LRG LVL3 (GOWN DISPOSABLE) ×1 IMPLANT
GOWN STRL REUS W/ TWL XL LVL3 (GOWN DISPOSABLE) ×1 IMPLANT
GOWN STRL REUS W/TWL LRG LVL3 (GOWN DISPOSABLE) ×2
GOWN STRL REUS W/TWL XL LVL3 (GOWN DISPOSABLE) ×2
NEEDLE HYPO 22GX1.5 SAFETY (NEEDLE) IMPLANT
NEEDLE HYPO 25X1 1.5 SAFETY (NEEDLE) ×3 IMPLANT
NS IRRIG 1000ML POUR BTL (IV SOLUTION) ×3 IMPLANT
PACK BASIN DAY SURGERY FS (CUSTOM PROCEDURE TRAY) ×3 IMPLANT
PENCIL BUTTON HOLSTER BLD 10FT (ELECTRODE) ×3 IMPLANT
SHEET MEDIUM DRAPE 40X70 STRL (DRAPES) IMPLANT
SLEEVE SCD COMPRESS KNEE MED (MISCELLANEOUS) IMPLANT
SPONGE LAP 4X18 RFD (DISPOSABLE) ×3 IMPLANT
STAPLER VISISTAT 35W (STAPLE) IMPLANT
STRIP CLOSURE SKIN 1/2X4 (GAUZE/BANDAGES/DRESSINGS) IMPLANT
SUT ETHILON 4 0 PS 2 18 (SUTURE) IMPLANT
SUT MNCRL AB 4-0 PS2 18 (SUTURE) IMPLANT
SUT SILK 2 0 SH (SUTURE) ×3 IMPLANT
SUT VIC AB 2-0 SH 27 (SUTURE)
SUT VIC AB 2-0 SH 27XBRD (SUTURE) IMPLANT
SUT VIC AB 3-0 FS2 27 (SUTURE) IMPLANT
SUT VIC AB 4-0 P-3 18XBRD (SUTURE) IMPLANT
SUT VIC AB 4-0 P3 18 (SUTURE)
SUT VICRYL 3-0 CR8 SH (SUTURE) ×3 IMPLANT
SUT VICRYL 4-0 PS2 18IN ABS (SUTURE) IMPLANT
SWAB COLLECTION DEVICE MRSA (MISCELLANEOUS) IMPLANT
SWAB CULTURE ESWAB REG 1ML (MISCELLANEOUS) IMPLANT
SYR 10ML LL (SYRINGE) ×3 IMPLANT
SYR BULB 3OZ (MISCELLANEOUS) IMPLANT
TAPE HYPAFIX 4 X10 (GAUZE/BANDAGES/DRESSINGS) IMPLANT
TOWEL GREEN STERILE FF (TOWEL DISPOSABLE) ×3 IMPLANT
TOWEL OR NON WOVEN STRL DISP B (DISPOSABLE) ×3 IMPLANT
TUBE CONNECTING 20'X1/4 (TUBING) ×1
TUBE CONNECTING 20X1/4 (TUBING) ×2 IMPLANT
YANKAUER SUCT BULB TIP NO VENT (SUCTIONS) ×3 IMPLANT

## 2017-12-04 NOTE — Anesthesia Postprocedure Evaluation (Signed)
Anesthesia Post Note  Patient: [name]  Procedure(s) Performed: EXCISION OF INFECTED CYST ON LEFT STERNUM (Left Chest)     Patient location during evaluation: PACU Anesthesia Type: General Level of consciousness: awake and alert Pain management: pain level controlled Vital Signs Assessment: post-procedure vital signs reviewed and stable Respiratory status: spontaneous breathing, nonlabored ventilation, respiratory function stable and patient connected to nasal cannula oxygen Cardiovascular status: blood pressure returned to baseline and stable Postop Assessment: no apparent nausea or vomiting Anesthetic complications: no    Last Vitals:  Vitals:   12/04/17 1115 12/04/17 1141  BP: 116/71 124/87  Pulse: 74 74  Resp: (!) 24 16  Temp:  36.5 C  SpO2: 95% 95%    Last Pain:  Vitals:   12/04/17 1141  TempSrc: Oral  PainSc: 0-No pain                 Phillips Groutarignan, Meleni Delahunt

## 2017-12-04 NOTE — Op Note (Signed)
Patient Name:           [name]   Date of Surgery:        12/04/2017  Pre op Diagnosis:      Inflamed left parasternal sebaceous cysts (2)  Post op Diagnosis:    Same  Procedure:                 Excision 2 left parasternal inflamed sebaceous cyst  Surgeon:                     Angelia MouldHaywood M. Derrell LollingIngram, M.D., FACS  Assistant:                      OR staff  Operative Indications:    This is a 57 year old female [facility]. 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 The patient relates that this sometimes swells up and gets red and drains and then settles down and always drains with a foul odor. This been going on 2 years. She's been on antibiotics at least twice but it persists. [facility] of this [facility] on July 01, 2017. They describe a 2.3 cm cyst with internal debris but no septation or thickening of the cyst wall was noted. This was noted in the left parasternal area, 11:00 area of the left breast. Felt to be category 3 probably benign. Read by a Dr. Stacie GlazeImam.     She actually has 2 areas that get inflamed and drained and she wants both of these excised.  One is in the left parasternal area in the upper area and one is in the left parasternal area mid to lower area.  We discussed this in detail the morning of surgery.  Ongoing tobacco abuse.     Exam reveals two draining wounds in the left parasternal area. There is not much mass and there is not any erythema. Total compression there were some foul-smelling minimally cloudy fluid that came out. Suspect sebaceous cyst.     I discussed options with her. I discussed observation only, and further antibiotics, or excision with open packing. She would like to have excision with open packing and hope that this would heal by secondary intention and that she was stopped having  foul-smelling drainage. That is reasonable She'll be scheduled for excision of both of these [facility] near future I discussed the indications, details, techniques, and risk of the surgery in detail. She is aware of the risk of nerve damage, chronic pain, recurrent infection, bleeding. She understands all of this. All questions are answered. She agrees with this plan. Marland Kitchen.  Operative Findings:       There was a chronically inflamed sebaceous cyst in the left parasternal area in the upper and about second intercostal space.  There was a second inflamed sebaceous cyst in the left parasternal area at about the fifth or sixth intercostal space.  Both of these were excised using 3.5 cm transverse elliptical incision.  There was no gross purulence  Procedure in Detail:          Following the induction of general LMA anesthesia the patient's anterior chest and left parasternal area was prepped and draped in a sterile fashion.  Surgical timeout was performed.  Intravenous antibiotics were given.  0.5% Marcaine  with epinephrine was used as a local infiltration anesthetic    Using a marking pen I marked both areas with transverse elliptical incisions.  The incisions were made.  The cyst were removed in their entirety.  There was no cellulitis or infection noted although there was some drainage which had a foul odor.  The wounds were irrigated..  The skin was closed with a running subcuticular 4-0 Monocryl on both incisions.  Dry bandages were placed and the patient taken to PACU in stable condition.  EBL 10 cc.  Counts correct.  Complications none.   Addendum: I logged on to the [employer] website and reviewed her prescription medication history     Kolden Dupee M. Derrell LollingIngram, M.D., FACS General and Minimally Invasive Surgery Breast and Colorectal Surgery  12/04/2017 10:37 AM

## 2017-12-04 NOTE — Anesthesia Procedure Notes (Signed)
Procedure Name: LMA Insertion Date/Time: 12/04/2017 10:19 AM Performed by: Pearson Grippeobertson, Idaly Verret M, CRNA Pre-anesthesia Checklist: Patient identified, Emergency Drugs available, Suction available and Patient being monitored Patient Re-evaluated:Patient Re-evaluated prior to induction Oxygen Delivery Method: Circle system utilized Preoxygenation: Pre-oxygenation with 100% oxygen Induction Type: IV induction Ventilation: Mask ventilation without difficulty LMA: LMA inserted LMA Size: 4.0 Number of attempts: 1 Placement Confirmation: positive ETCO2 Tube secured with: Tape Dental Injury: Teeth and Oropharynx as per pre-operative assessment

## 2017-12-04 NOTE — Interval H&P Note (Signed)
History and Physical Interval Note:  12/04/2017 9:42 AM  Isabella DubsBetty F Baruch  has presented today for surgery, with the diagnosis of Cyst on left sternum  The various methods of treatment have been discussed with the patient and family. After consideration of risks, benefits and other options for treatment, the patient has consented to  Procedure(s): EXCISION OF INFECTED CYST ON LEFT STERNUM (N/A) as a surgical intervention .  The patient's history has been reviewed, patient examined, no change in status, stable for surgery.  I have reviewed the patient's chart and labs.  Questions were answered to the patient's satisfaction.     Ernestene MentionHaywood M Elese Rane

## 2017-12-04 NOTE — Discharge Instructions (Signed)
Keep a clean dry bandage on the incisions for 5 or 6 days.  You may take a quick shower, starting on Sunday, August 18. No tub baths or swimming pools for 1 month  [facility] the wound every day to be sure it does not get infected  For pain, take Tylenol 1000 mg every 6 hours as needed  Return to see Dr. Derrell LollingIngram if any surgical problems arise    Call your surgeon if you experience:   1.  Fever over 101.0. 2.  Inability to urinate. 3.  Nausea and/or vomiting. 4.  Extreme swelling or bruising at the surgical site. 5.  Continued bleeding from the incision. 6.  Increased pain, redness or drainage from the incision. 7.  Problems related to your pain medication. 8.  Any problems and/or concerns    Post Anesthesia Home Care Instructions  Activity: Get plenty of rest for the remainder of the day. A responsible individual must stay with you for 24 hours following the procedure.  For the next 24 hours, DO NOT: -Drive a car -Advertising copywriterperate machinery -Drink alcoholic beverages -Take any medication unless instructed by your physician -Make any legal decisions or sign important papers.  Meals: Start with liquid foods such as gelatin or soup. Progress to regular foods as tolerated. Avoid greasy, spicy, heavy foods. If nausea and/or vomiting occur, drink only clear liquids until the nausea and/or vomiting subsides. Call your physician if vomiting continues.  Special Instructions/Symptoms: Your throat may feel dry or sore from the anesthesia or the breathing tube placed in your throat during surgery. If this causes discomfort, gargle with warm salt water. The discomfort should disappear within 24 hours.  If you had a scopolamine patch placed behind your ear for the management of post- operative nausea and/or vomiting:  1. The medication in the patch is effective for 72 hours, after which it should be removed.  Wrap patch in a tissue and discard  in the trash. Wash hands thoroughly with soap and water. 2. You may remove the patch earlier than 72 hours if you experience unpleasant side effects which may include dry mouth, dizziness or visual disturbances. 3. Avoid touching the patch. Wash your hands with soap and water after contact with the patch.

## 2017-12-04 NOTE — Anesthesia Preprocedure Evaluation (Signed)
Anesthesia Evaluation  Patient identified by MRN, date of birth, ID band Patient awake    Reviewed: Allergy & Precautions, NPO status , Patient's Chart, lab work & pertinent test results  Airway Mallampati: II  TM Distance: >3 FB Neck ROM: Full    Dental no notable dental hx. (+) Poor Dentition   Pulmonary COPD, former smoker,    Pulmonary exam normal breath sounds clear to auscultation       Cardiovascular negative cardio ROS Normal cardiovascular exam Rhythm:Regular Rate:Normal     Neuro/Psych negative neurological ROS  negative psych ROS   GI/Hepatic negative GI ROS, Neg liver ROS,   Endo/Other  negative endocrine ROS  Renal/GU negative Renal ROS  negative genitourinary   Musculoskeletal negative musculoskeletal ROS (+)   Abdominal   Peds negative pediatric ROS (+)  Hematology negative hematology ROS (+)   Anesthesia Other Findings   Reproductive/Obstetrics negative OB ROS                             Anesthesia Physical Anesthesia Plan  ASA: II  Anesthesia Plan: General   Post-op Pain Management:    Induction: Intravenous  PONV Risk Score and Plan: 3 and Ondansetron, Dexamethasone, Midazolam and Treatment may vary due to age or medical condition  Airway Management Planned: LMA  Additional Equipment:   Intra-op Plan:   Post-operative Plan:   Informed Consent: I have reviewed the patients History and Physical, chart, labs and discussed the procedure including the risks, benefits and alternatives for the proposed anesthesia with the patient or authorized representative who has indicated his/her understanding and acceptance.   Dental advisory given  Plan Discussed with: CRNA  Anesthesia Plan Comments:         Anesthesia Quick Evaluation

## 2017-12-04 NOTE — Transfer of Care (Signed)
Immediate Anesthesia Transfer of Care Note  Patient: [name]  Procedure(s) Performed: EXCISION OF INFECTED CYST ON LEFT STERNUM (Left Chest)  Patient Location: PACU  Anesthesia Type:General  Level of Consciousness: drowsy and patient cooperative  Airway & Oxygen Therapy: Patient Spontanous Breathing and Patient connected to face mask oxygen  Post-op Assessment: Report given to RN and Post -op Vital signs reviewed and stable  Post vital signs: Reviewed and stable  Last Vitals:  Vitals Value Taken Time  BP    Temp    Pulse 82 12/04/2017 10:44 AM  Resp    SpO2 100 % 12/04/2017 10:44 AM  Vitals shown include unvalidated device data.  Last Pain:  Vitals:   12/04/17 0907  TempSrc: Oral  PainSc: 0-No pain         Complications: No apparent anesthesia complications

## 2017-12-07 ENCOUNTER — Encounter (HOSPITAL_BASED_OUTPATIENT_CLINIC_OR_DEPARTMENT_OTHER): Payer: Self-pay | Admitting: General Surgery

## 2017-12-07 NOTE — Progress Notes (Signed)
Inform patient of Pathology report,.  The 2 draining lesions from her parasternal area or benign epidermal inclusion cyst.  Otherwise known as sebaceous cyst.  [facility]  See if you can get this information to her verbally.  hmi
